# Patient Record
Sex: Female | Born: 1985
Health system: Southern US, Community
[De-identification: ages and names within clinical notes are randomized; demographics above are authoritative.]

## PROBLEM LIST (undated history)

## (undated) DIAGNOSIS — F419 Anxiety disorder, unspecified: Secondary | ICD-10-CM

## (undated) DIAGNOSIS — M419 Scoliosis, unspecified: Secondary | ICD-10-CM

## (undated) DIAGNOSIS — G43909 Migraine, unspecified, not intractable, without status migrainosus: Secondary | ICD-10-CM

## (undated) DIAGNOSIS — J45909 Unspecified asthma, uncomplicated: Secondary | ICD-10-CM

## (undated) HISTORY — PX: MOUTH SURGERY: SHX715

## (undated) HISTORY — DX: Unspecified asthma, uncomplicated: J45.909

## (undated) HISTORY — DX: Anxiety disorder, unspecified: F41.9

## (undated) HISTORY — PX: TONSILLECTOMY: SUR1361

## (undated) HISTORY — DX: Migraine, unspecified, not intractable, without status migrainosus: G43.909

---
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
## 2008-03-18 ENCOUNTER — Inpatient Hospital Stay (HOSPITAL_COMMUNITY): Admission: AD | Admit: 2008-03-18 | Discharge: 2008-03-20 | Payer: Self-pay | Admitting: Obstetrics & Gynecology

## 2008-03-18 ENCOUNTER — Ambulatory Visit: Payer: Self-pay | Admitting: Obstetrics & Gynecology

## 2008-03-31 ENCOUNTER — Ambulatory Visit: Payer: Self-pay | Admitting: Obstetrics & Gynecology

## 2008-03-31 ENCOUNTER — Encounter: Payer: Self-pay | Admitting: Family Medicine

## 2008-03-31 ENCOUNTER — Encounter: Payer: Self-pay | Admitting: Obstetrics & Gynecology

## 2008-04-07 ENCOUNTER — Ambulatory Visit: Payer: Self-pay | Admitting: Obstetrics & Gynecology

## 2008-04-08 ENCOUNTER — Ambulatory Visit (HOSPITAL_COMMUNITY): Admission: RE | Admit: 2008-04-08 | Discharge: 2008-04-08 | Payer: Self-pay | Admitting: Obstetrics & Gynecology

## 2008-04-08 ENCOUNTER — Encounter: Payer: Self-pay | Admitting: Obstetrics & Gynecology

## 2008-04-14 ENCOUNTER — Ambulatory Visit: Payer: Self-pay | Admitting: Family Medicine

## 2008-04-21 ENCOUNTER — Ambulatory Visit: Payer: Self-pay | Admitting: Family Medicine

## 2008-04-26 ENCOUNTER — Inpatient Hospital Stay (HOSPITAL_COMMUNITY): Admission: AD | Admit: 2008-04-26 | Discharge: 2008-04-29 | Payer: Self-pay | Admitting: Obstetrics & Gynecology

## 2008-04-26 ENCOUNTER — Ambulatory Visit: Payer: Self-pay | Admitting: Obstetrics and Gynecology

## 2009-05-22 ENCOUNTER — Emergency Department (HOSPITAL_COMMUNITY): Admission: EM | Admit: 2009-05-22 | Discharge: 2009-05-22 | Payer: Self-pay | Admitting: Emergency Medicine

## 2009-11-04 ENCOUNTER — Emergency Department (HOSPITAL_COMMUNITY): Admission: EM | Admit: 2009-11-04 | Discharge: 2009-11-04 | Payer: Self-pay | Admitting: Emergency Medicine

## 2010-07-01 ENCOUNTER — Inpatient Hospital Stay (INDEPENDENT_AMBULATORY_CARE_PROVIDER_SITE_OTHER)
Admission: RE | Admit: 2010-07-01 | Discharge: 2010-07-01 | Disposition: A | Discharge status: Home / Self Care | Payer: Medicaid Other | Source: Ambulatory Visit | Attending: Emergency Medicine | Admitting: Emergency Medicine

## 2010-07-01 DIAGNOSIS — J069 Acute upper respiratory infection, unspecified: Secondary | ICD-10-CM

## 2010-07-01 DIAGNOSIS — R112 Nausea with vomiting, unspecified: Secondary | ICD-10-CM

## 2010-07-01 LAB — POCT PREGNANCY, URINE: Preg Test, Ur: NEGATIVE

## 2010-07-01 LAB — POCT URINALYSIS DIPSTICK
Hgb urine dipstick: NEGATIVE
Nitrite: NEGATIVE
Protein, ur: NEGATIVE mg/dL
Specific Gravity, Urine: 1.02 (ref 1.005–1.030)
pH: 6 (ref 5.0–8.0)

## 2010-07-19 LAB — BASIC METABOLIC PANEL
CO2: 26 mEq/L (ref 19–32)
Calcium: 8.8 mg/dL (ref 8.4–10.5)
Creatinine, Ser: 0.7 mg/dL (ref 0.4–1.2)

## 2010-07-19 LAB — DIFFERENTIAL
Basophils Absolute: 0 10*3/uL (ref 0.0–0.1)
Basophils Relative: 0 % (ref 0–1)
Eosinophils Relative: 0 % (ref 0–5)
Lymphocytes Relative: 10 % — ABNORMAL LOW (ref 12–46)
Monocytes Absolute: 1.4 10*3/uL — ABNORMAL HIGH (ref 0.1–1.0)
Neutro Abs: 9.5 10*3/uL — ABNORMAL HIGH (ref 1.7–7.7)

## 2010-07-19 LAB — CBC
HCT: 41.4 % (ref 36.0–46.0)
Hemoglobin: 13.8 g/dL (ref 12.0–15.0)
MCHC: 33.4 g/dL (ref 30.0–36.0)
Platelets: 125 10*3/uL — ABNORMAL LOW (ref 150–400)
RBC: 4.39 MIL/uL (ref 3.87–5.11)
RDW: 13.1 % (ref 11.5–15.5)

## 2010-07-19 LAB — URINE CULTURE: Colony Count: >=100000

## 2010-07-19 LAB — URINALYSIS, ROUTINE W REFLEX MICROSCOPIC
Glucose, UA: NEGATIVE mg/dL
Nitrite: POSITIVE — AB
Specific Gravity, Urine: 1.02 (ref 1.005–1.030)
Urobilinogen, UA: 2 mg/dL — ABNORMAL HIGH (ref 0.0–1.0)
pH: 5.5 (ref 5.0–8.0)

## 2010-07-19 LAB — URINE MICROSCOPIC-ADD ON

## 2010-07-21 ENCOUNTER — Emergency Department (HOSPITAL_COMMUNITY)
Admission: EM | Admit: 2010-07-21 | Discharge: 2010-07-21 | Disposition: A | Discharge status: Home / Self Care | Payer: Medicaid Other | Attending: Emergency Medicine | Admitting: Emergency Medicine

## 2010-07-21 DIAGNOSIS — H9209 Otalgia, unspecified ear: Secondary | ICD-10-CM | POA: Insufficient documentation

## 2010-07-21 DIAGNOSIS — J069 Acute upper respiratory infection, unspecified: Secondary | ICD-10-CM | POA: Insufficient documentation

## 2010-09-14 NOTE — Discharge Summary (Signed)
NAMELINNAE, Samantha Nicholson                 ACCOUNT NO.:  [id number]   MEDICAL RECORD NO.:  [id number]          PATIENT TYPE:  INP   LOCATION:  9154                          FACILITY:  [facility]   PHYSICIAN:  Tanya S. Shawnie Pons, M.D.   DATE OF BIRTH:  [birth year]   DATE OF ADMISSION:  03/18/2008  DATE OF DISCHARGE:  03/20/2008                               DISCHARGE SUMMARY   FINAL DIAGNOSES:  1. Intrauterine pregnancy at 32 weeks.  2. No prenatal care.  3. Preterm contractions and shortened cervix.  4. History of preterm birth in the past.   PERTINENT LABS:  Urine drug screen was negative, GC negative, Chlamydia  negative.  SSA and fetal fibronectin was negative.  Blood type was A  positive.  Antibody screen negative.  Rapid HIV nonreactive.  Hemoglobin  11.1, white blood cell count 15, and platelets 168.  Wet prep shows few  clue cells.  Negative urinalysis.   X-RAYS:  The patient underwent OB ultrasound that revealed a single  intrauterine pregnancy in a vertex presentation with AFI of 11.8.  Estimated fetal weight was 1802 by an unsure LMP.  Her EDC would have  been on May 21, 2008 by ultrasound.  Her EDC is on May 13, 2008.   REASON FOR ADMISSION:  Briefly, please see H&P in the chart.  The  patient presented to the Maternity Admissions Unit with complained of  abdominal pain.  She reported just fearing out as she was pregnant.  The  patient was found to be having some contractions on cervix that was  short and ultrasound of 2.3 cm and was fingertip dilated, and for this  reason, she was admitted for 48 hours for betamethasone for tocolysis.   HOSPITAL COURSE:  The patient was admitted to the antenatal service.  She was given Procardia, which did not hold her contractions.  She was  switched to magnesium sulfate, which she received for approximately 12  hours.  After that, she was started back on Procardia XL 30 mg.  She got  a NICU consult.  She continued to have some contractions,  but no  significant cervical change.  Her cervix was checked on the evening  prior to discharge and it was still fingertip dilated.  She received two  courses of betamethasone and it was felt with a negative SSA, and she  was stable for discharge.   DISCHARGE DISPOSITION AND CONDITION:  The patient was discharged home in  improved condition.  Follow up will be at the [facility] on  March 31, 2008 at 8:45 in the morning.  The patient was instructed to  remain on bed rest as much as possible.  Preterm labor precautions were  given.   DISCHARGE MEDICATIONS:  1. Flagyl 500 mg 1 p.o. b.i.d. x7 days.  2. Procardia XL, we will increase to 60 mg 1 p.o. b.i.d. acute      contractions.       Shelbie Proctor. Shawnie Pons, M.D.  Electronically Signed     TSP/MEDQ  D:  03/20/2008  T:  03/20/2008  Job:  [id number]

## 2011-01-01 ENCOUNTER — Emergency Department (HOSPITAL_COMMUNITY)
Admission: EM | Admit: 2011-01-01 | Discharge: 2011-01-01 | Disposition: A | Discharge status: Home / Self Care | Payer: Medicaid Other | Attending: Emergency Medicine | Admitting: Emergency Medicine

## 2011-01-01 ENCOUNTER — Emergency Department (HOSPITAL_COMMUNITY): Payer: Medicaid Other

## 2011-01-01 DIAGNOSIS — M25539 Pain in unspecified wrist: Secondary | ICD-10-CM | POA: Insufficient documentation

## 2011-01-01 DIAGNOSIS — M4120 Other idiopathic scoliosis, site unspecified: Secondary | ICD-10-CM | POA: Insufficient documentation

## 2011-02-01 LAB — CBC
Hemoglobin: 11.1 — ABNORMAL LOW
MCHC: 34.7
MCV: 96.9
Platelets: 168
RDW: 13.3
WBC: 15.1 — ABNORMAL HIGH

## 2011-02-01 LAB — STREP B DNA PROBE: Strep Group B Ag: NEGATIVE

## 2011-02-01 LAB — URINALYSIS, ROUTINE W REFLEX MICROSCOPIC
Hgb urine dipstick: NEGATIVE
Nitrite: NEGATIVE
Specific Gravity, Urine: 1.015

## 2011-02-01 LAB — RAPID URINE DRUG SCREEN, HOSP PERFORMED
Amphetamines: NOT DETECTED
Cocaine: NOT DETECTED

## 2011-02-01 LAB — GC/CHLAMYDIA PROBE AMP, GENITAL
Chlamydia, DNA Probe: NEGATIVE
GC Probe Amp, Genital: NEGATIVE

## 2011-02-01 LAB — RPR: RPR Ser Ql: NONREACTIVE

## 2011-02-01 LAB — DIFFERENTIAL
Basophils Relative: 0
Lymphocytes Relative: 6 — ABNORMAL LOW
Lymphs Abs: 0.9
Monocytes Relative: 2 — ABNORMAL LOW
Neutro Abs: 13.9 — ABNORMAL HIGH
Neutrophils Relative %: 93 — ABNORMAL HIGH

## 2011-02-01 LAB — POCT URINALYSIS DIP (DEVICE)
Bilirubin Urine: NEGATIVE
Glucose, UA: NEGATIVE
Nitrite: NEGATIVE
pH: 6.5

## 2011-02-01 LAB — RUBELLA SCREEN: Rubella: 40.1 — ABNORMAL HIGH

## 2011-02-01 LAB — RAPID HIV SCREEN (WH-MAU): Rapid HIV Screen: NONREACTIVE

## 2011-02-01 LAB — WET PREP, GENITAL

## 2011-02-04 LAB — POCT URINALYSIS DIP (DEVICE)
Bilirubin Urine: NEGATIVE
Glucose, UA: NEGATIVE mg/dL
Glucose, UA: NEGATIVE mg/dL
Glucose, UA: NEGATIVE mg/dL
Hgb urine dipstick: NEGATIVE
Ketones, ur: NEGATIVE mg/dL
Nitrite: NEGATIVE
Nitrite: NEGATIVE
Protein, ur: 30 mg/dL — AB
Specific Gravity, Urine: 1.02 (ref 1.005–1.030)
Urobilinogen, UA: 0.2 mg/dL (ref 0.0–1.0)

## 2011-02-04 LAB — CBC
Hemoglobin: 12 g/dL (ref 12.0–15.0)
RBC: 3.59 MIL/uL — ABNORMAL LOW (ref 3.87–5.11)

## 2011-02-17 LAB — WET PREP, GENITAL
Clue Cells Wet Prep HPF POC: NONE SEEN
Yeast Wet Prep HPF POC: NONE SEEN

## 2011-02-17 LAB — CBC
HCT: 36.4
Hemoglobin: 12.4
MCV: 93.3
RBC: 3.9
WBC: 9.5

## 2011-02-17 LAB — GC/CHLAMYDIA PROBE AMP, GENITAL: Chlamydia, DNA Probe: NEGATIVE

## 2011-05-04 ENCOUNTER — Other Ambulatory Visit: Payer: Self-pay | Admitting: Otolaryngology

## 2012-08-16 ENCOUNTER — Emergency Department (HOSPITAL_COMMUNITY)
Admission: EM | Admit: 2012-08-16 | Discharge: 2012-08-16 | Disposition: A | Discharge status: Home / Self Care | Payer: Medicaid Other | Source: Home / Self Care | Attending: Family Medicine | Admitting: Family Medicine

## 2012-08-16 ENCOUNTER — Encounter (HOSPITAL_COMMUNITY): Payer: Self-pay | Admitting: Emergency Medicine

## 2012-08-16 DIAGNOSIS — J309 Allergic rhinitis, unspecified: Secondary | ICD-10-CM

## 2012-08-16 MED ORDER — CETIRIZINE-PSEUDOEPHEDRINE ER 5-120 MG PO TB12: 1.0000 | ORAL_TABLET | Freq: Two times a day (BID) | ORAL | Status: DC | PRN

## 2012-08-16 MED ORDER — PREDNISONE 20 MG PO TABS: ORAL_TABLET | ORAL | Status: DC

## 2012-08-16 MED ORDER — GUAIFENESIN-CODEINE 100-10 MG/5ML PO SYRP: 5.0000 mL | ORAL_SOLUTION | Freq: Three times a day (TID) | ORAL | Status: DC | PRN

## 2012-08-16 NOTE — ED Notes (Signed)
Pt c/o cold sx onset Saturday Sx include: dry cough, sneezing, nasal congestion, runny nose Denies: f/v/n/d Reports that her daughter was dx w/Strep on Sat  She is alert and oriented w/no signs of acute distress.

## 2012-08-16 NOTE — ED Provider Notes (Signed)
History     CSN: [id number]  Arrival date & time 08/16/12  1151   First MD Initiated Contact with Patient 08/16/12 1220      Chief Complaint  Patient presents with  . URI    (Consider location/radiation/quality/duration/timing/severity/associated sxs/prior treatment) HPI Comments: 27 year old female with no significant past medical history. Here complaining of nasal congestion, clear rhinorrhea, sinus pressure, sneezing and dry cough for 4 days. Denies fever or chills. No chest pain or difficulty breathing. Patient concerned as her child was diagnosed with a strep throat 5 days ago. Patient denies sore throat, no headache or abdominal pain. No nausea or vomiting. No diarrhea. No rash.   History reviewed. No pertinent past medical history.  Past Surgical History  Procedure Laterality Date  . Tonsillectomy    . Mouth surgery      No family history on file.  History  Substance Use Topics  . Smoking status: Never Smoker   . Smokeless tobacco: Not on file  . Alcohol Use: No    OB History   Grav Para Term Preterm Abortions TAB SAB Ect Mult Living                  Review of Systems  Constitutional: Negative for fever, chills, diaphoresis, activity change, appetite change and fatigue.  HENT: Positive for congestion, rhinorrhea, sneezing and sinus pressure. Negative for sore throat.   Eyes: Positive for itching.  Respiratory: Negative for cough, shortness of breath and wheezing.   Cardiovascular: Negative for chest pain.  Gastrointestinal: Negative for nausea, vomiting and abdominal pain.  Skin: Negative for rash.  Neurological: Negative for headaches.    Allergies  Review of patient's allergies indicates no known allergies.  Home Medications   Current Outpatient Rx  Name  Route  Sig  Dispense  Refill  . cetirizine-pseudoephedrine (ZYRTEC-D) 5-120 MG per tablet   Oral   Take 1 tablet by mouth 2 (two) times daily as needed for allergies or rhinitis.   30 tablet  0   . guaiFENesin-codeine (ROBITUSSIN AC) 100-10 MG/5ML syrup   Oral   Take 5 mLs by mouth 3 (three) times daily as needed for cough.   120 mL   0   . predniSONE (DELTASONE) 20 MG tablet      2 tabs po daily for 5 days   10 tablet   0     BP 125/77  Pulse 85  Temp(Src) 98.1 F (36.7 C) (Oral)  Resp 18  SpO2 98%  Physical Exam  Nursing note and vitals reviewed. Constitutional: She is oriented to person, place, and time. She appears well-developed and well-nourished. No distress.  HENT:  Head: Normocephalic and atraumatic.  Nasal Congestion with erythema and swelling of nasal turbinates, clear rhinorrhea. No pharyngeal erythema no exudates. No uvula deviation. No trismus. TM's with clear fluid behind bilaterally otherwise normal.  Neck: Neck supple.  Cardiovascular: Normal heart sounds.   Pulmonary/Chest: Breath sounds normal.  Lymphadenopathy:    She has no cervical adenopathy.  Neurological: She is alert and oriented to person, place, and time.  Skin: No rash noted. She is not diaphoretic.    ED Course  Procedures (including critical care time)  Labs Reviewed  POCT RAPID STREP A ([facility] CARE ONLY)   No results found.   1. Allergic rhinosinusitis       MDM  Negative strep test. Treated with prednisone, cetirizine/pseudoephedrine and guaifenesin/codeine. Supportive care and red flags that should prompt her return to medical attention discussed  with patient and provided in writing.        Sharin Grave, MD 08/17/12 [phone]

## 2012-11-18 ENCOUNTER — Encounter (HOSPITAL_COMMUNITY): Payer: Self-pay | Admitting: Emergency Medicine

## 2012-11-18 ENCOUNTER — Emergency Department (HOSPITAL_COMMUNITY)
Admission: EM | Admit: 2012-11-18 | Discharge: 2012-11-19 | Disposition: A | Discharge status: Home / Self Care | Payer: Medicaid Other | Attending: Emergency Medicine | Admitting: Emergency Medicine

## 2012-11-18 DIAGNOSIS — Z3202 Encounter for pregnancy test, result negative: Secondary | ICD-10-CM | POA: Insufficient documentation

## 2012-11-18 DIAGNOSIS — R61 Generalized hyperhidrosis: Secondary | ICD-10-CM | POA: Insufficient documentation

## 2012-11-18 DIAGNOSIS — R0989 Other specified symptoms and signs involving the circulatory and respiratory systems: Secondary | ICD-10-CM | POA: Insufficient documentation

## 2012-11-18 DIAGNOSIS — R6883 Chills (without fever): Secondary | ICD-10-CM | POA: Insufficient documentation

## 2012-11-18 DIAGNOSIS — R110 Nausea: Secondary | ICD-10-CM | POA: Insufficient documentation

## 2012-11-18 DIAGNOSIS — M940 Chondrocostal junction syndrome [Tietze]: Secondary | ICD-10-CM | POA: Insufficient documentation

## 2012-11-18 DIAGNOSIS — R0609 Other forms of dyspnea: Secondary | ICD-10-CM | POA: Insufficient documentation

## 2012-11-18 DIAGNOSIS — M6281 Muscle weakness (generalized): Secondary | ICD-10-CM | POA: Insufficient documentation

## 2012-11-18 DIAGNOSIS — Z79899 Other long term (current) drug therapy: Secondary | ICD-10-CM | POA: Insufficient documentation

## 2012-11-18 LAB — CBC WITH DIFFERENTIAL/PLATELET
Basophils Relative: 0 % (ref 0–1)
Eosinophils Absolute: 0 10*3/uL (ref 0.0–0.7)
Hemoglobin: 13.7 g/dL (ref 12.0–15.0)
MCH: 31.7 pg (ref 26.0–34.0)
MCHC: 35.2 g/dL (ref 30.0–36.0)
Monocytes Relative: 8 % (ref 3–12)
Neutrophils Relative %: 63 % (ref 43–77)
Platelets: 175 10*3/uL (ref 150–400)

## 2012-11-18 NOTE — ED Notes (Signed)
Pt states she donated blood for the 1st time yesterday.  2 hours later she started having generalized weakness, feeling cold, headache, intermittent stabbing pain in center of chest, sob, dizziness, nausea, and pain in center of abd.

## 2012-11-18 NOTE — ED Notes (Signed)
Phlebotomy called.  

## 2012-11-18 NOTE — ED Notes (Signed)
2340  Pt ambulatory to the room

## 2012-11-18 NOTE — ED Notes (Signed)
Phlebotomy called x2

## 2012-11-19 ENCOUNTER — Emergency Department (HOSPITAL_COMMUNITY): Payer: Medicaid Other

## 2012-11-19 LAB — URINALYSIS, ROUTINE W REFLEX MICROSCOPIC
Glucose, UA: NEGATIVE mg/dL
Ketones, ur: 15 mg/dL — AB
Leukocytes, UA: NEGATIVE
Specific Gravity, Urine: 1.031 — ABNORMAL HIGH (ref 1.005–1.030)
pH: 6 (ref 5.0–8.0)

## 2012-11-19 LAB — POCT PREGNANCY, URINE: Preg Test, Ur: NEGATIVE

## 2012-11-19 LAB — COMPREHENSIVE METABOLIC PANEL
Albumin: 3.6 g/dL (ref 3.5–5.2)
Alkaline Phosphatase: 65 U/L (ref 39–117)
BUN: 8 mg/dL (ref 6–23)
Calcium: 9.1 mg/dL (ref 8.4–10.5)
Potassium: 3.9 mEq/L (ref 3.5–5.1)
Total Protein: 7.1 g/dL (ref 6.0–8.3)

## 2012-11-19 MED ORDER — SODIUM CHLORIDE 0.9 % IV BOLUS (SEPSIS)
1000.0000 mL | Freq: Once | INTRAVENOUS | Status: AC
  Administered 2012-11-19: 1000 mL via INTRAVENOUS

## 2012-11-19 MED ORDER — KETOROLAC TROMETHAMINE 30 MG/ML IJ SOLN
30.0000 mg | Freq: Once | INTRAMUSCULAR | Status: AC
  Administered 2012-11-19: 30 mg via INTRAVENOUS
  Filled 2012-11-19: qty 1

## 2012-11-19 NOTE — ED Provider Notes (Signed)
History    CSN: [id number] Arrival date & time 11/18/12  2136  First MD Initiated Contact with Patient 11/19/12 0135     Chief Complaint  Patient presents with  . Weakness  . Chest Pain   (Consider location/radiation/quality/duration/timing/severity/associated sxs/prior Treatment) Patient is a 27 y.o. female presenting with weakness and chest pain. The history is provided by the patient.  Weakness Associated symptoms include chest pain.  Chest Pain Associated symptoms: weakness   She donated blood on July 19 and following that, I felt generally weak. She went home and drank a lot of fluids. That evening, she started developing a left parasternal chest pain. Pain is sharp and worse with deep breathing and worse with movement. She rates the pain at 6/10. There is associated dyspnea, nausea, diaphoresis and she did have chills. She denies any cough. She has not treated it with anything. She has no significant past medical history and is a nonsmoker. History reviewed. No pertinent past medical history. Past Surgical History  Procedure Laterality Date  . Tonsillectomy    . Mouth surgery     No family history on file. History  Substance Use Topics  . Smoking status: Never Smoker   . Smokeless tobacco: Not on file  . Alcohol Use: No   OB History   Grav Para Term Preterm Abortions TAB SAB Ect Mult Living                 Review of Systems  Cardiovascular: Positive for chest pain.  Neurological: Positive for weakness.  All other systems reviewed and are negative.    Allergies  Review of patient's allergies indicates no known allergies.  Home Medications   Current Outpatient Rx  Name  Route  Sig  Dispense  Refill  . etonogestrel (NEXPLANON) 68 MG IMPL implant   Subcutaneous   Inject 1 each into the skin once.          BP 128/97  Pulse 105  Temp(Src) 97.8 F (36.6 C) (Oral)  Resp 18  SpO2 99%  LMP 10/28/2012 Physical Exam  Nursing note and vitals  reviewed.  27 year old female, resting comfortably and in no acute distress. Vital signs are significant for mild tachycardia with heart rate 105, diastolic hypertension with blood pressure 120/97. Oxygen saturation is 99%, which is normal. Head is normocephalic and atraumatic. PERRLA, EOMI. Oropharynx is clear. Neck is nontender and supple without adenopathy or JVD. Back is nontender and there is no CVA tenderness. Lungs are clear without rales, wheezes, or rhonchi. Chest is  mildly tender in the left parasternal area which reproduces her pain. Heart has regular rate and rhythm without murmur. Abdomen is soft, flat, nontender without masses or hepatosplenomegaly and peristalsis is normoactive. Extremities have no cyanosis or edema, full range of motion is present. Skin is warm and dry without rash. Neurologic: Mental status is normal, cranial nerves are intact, there are no motor or sensory deficits.   ED Course  Procedures (including critical care time) Results for orders placed during the hospital encounter of 11/18/12  CBC WITH DIFFERENTIAL      Result Value Range   WBC 10.0  4.0 - 10.5 K/uL   RBC 4.32  3.87 - 5.11 MIL/uL   Hemoglobin 13.7  12.0 - 15.0 g/dL   HCT 40.9  81.1 - 91.4 %   MCV 90.0  78.0 - 100.0 fL   MCH 31.7  26.0 - 34.0 pg   MCHC 35.2  30.0 - 36.0 g/dL  RDW 12.5  11.5 - 15.5 %   Platelets 175  150 - 400 K/uL   Neutrophils Relative % 63  43 - 77 %   Neutro Abs 6.3  1.7 - 7.7 K/uL   Lymphocytes Relative 29  12 - 46 %   Lymphs Abs 2.9  0.7 - 4.0 K/uL   Monocytes Relative 8  3 - 12 %   Monocytes Absolute 0.8  0.1 - 1.0 K/uL   Eosinophils Relative 0  0 - 5 %   Eosinophils Absolute 0.0  0.0 - 0.7 K/uL   Basophils Relative 0  0 - 1 %   Basophils Absolute 0.0  0.0 - 0.1 K/uL  COMPREHENSIVE METABOLIC PANEL      Result Value Range   Sodium 140  135 - 145 mEq/L   Potassium 3.9  3.5 - 5.1 mEq/L   Chloride 106  96 - 112 mEq/L   CO2 27  19 - 32 mEq/L   Glucose, Bld  114 (*) 70 - 99 mg/dL   BUN 8  6 - 23 mg/dL   Creatinine, Ser 1.61  0.50 - 1.10 mg/dL   Calcium 9.1  8.4 - 09.6 mg/dL   Total Protein 7.1  6.0 - 8.3 g/dL   Albumin 3.6  3.5 - 5.2 g/dL   AST 14  0 - 37 U/L   ALT 18  0 - 35 U/L   Alkaline Phosphatase 65  39 - 117 U/L   Total Bilirubin 0.4  0.3 - 1.2 mg/dL   GFR calc non Af Amer >90  >90 mL/min   GFR calc Af Amer >90  >90 mL/min  URINALYSIS, ROUTINE W REFLEX MICROSCOPIC      Result Value Range   Color, Urine AMBER (*) YELLOW   APPearance CLEAR  CLEAR   Specific Gravity, Urine 1.031 (*) 1.005 - 1.030   pH 6.0  5.0 - 8.0   Glucose, UA NEGATIVE  NEGATIVE mg/dL   Hgb urine dipstick NEGATIVE  NEGATIVE   Bilirubin Urine SMALL (*) NEGATIVE   Ketones, ur 15 (*) NEGATIVE mg/dL   Protein, ur NEGATIVE  NEGATIVE mg/dL   Urobilinogen, UA 1.0  0.0 - 1.0 mg/dL   Nitrite NEGATIVE  NEGATIVE   Leukocytes, UA NEGATIVE  NEGATIVE  D-DIMER, QUANTITATIVE      Result Value Range   D-Dimer, Quant <0.27  0.00 - 0.48 ug/mL-FEU  POCT PREGNANCY, URINE      Result Value Range   Preg Test, Ur NEGATIVE  NEGATIVE  POCT I-STAT TROPONIN I      Result Value Range   Troponin i, poc 0.00  0.00 - 0.08 ng/mL   Comment 3            Dg Chest 2 View  11/19/2012   *RADIOLOGY REPORT*  Clinical Data: Weakness, chest pain  CHEST - 2 VIEW  Comparison: None.  Findings: Cardiac and mediastinal silhouettes within normal limits.  Lungs are normally inflated.  No airspace consolidation, pleural effusion, or pulmonary edema.  No pneumothorax.  Dextroscoliosis of the thoracic spine is noted.  No acute osseous abnormality.  IMPRESSION: 1.  No acute cardiopulmonary process. 2.  Dextroscoliosis of the thoracic spine.   Original Report Authenticated By: Rise Mu, M.D.      Date: 11/19/2012  Rate: 122  Rhythm: sinus tachycardia  QRS Axis: normal  Intervals: normal  ST/T Wave abnormalities: nonspecific ST/T changes  Conduction Disutrbances:none  Narrative  Interpretation: Sinus tachycardia with nonspecific ST and T changes are  of uncertain significance. No prior ECG available for comparison.  Old EKG Reviewed: none available   1. Costochondritis     MDM   chest pain which seems most consistent with costochondritis. I cannot relate her chest pain in any way to her having given blood. Chest x-ray will be obtained and she will be given an bolus of IV fluid. D-dimer will be obtained to rule out pulmonary embolism since she is on hormonal contraception.  Workup is negative including normal d-dimer, and troponin, normal chest x-ray. She got excellent relief of pain with ketorolac. She is discharged with instructions to use over-the-counter ibuprofen or naproxen as needed.  Dione Booze, MD 11/19/12 [phone]

## 2013-03-07 ENCOUNTER — Other Ambulatory Visit: Payer: Self-pay

## 2014-08-03 ENCOUNTER — Encounter (HOSPITAL_COMMUNITY): Payer: Self-pay | Admitting: *Deleted

## 2014-08-03 ENCOUNTER — Emergency Department (HOSPITAL_COMMUNITY)
Admission: EM | Admit: 2014-08-03 | Discharge: 2014-08-03 | Disposition: A | Discharge status: Home / Self Care | Payer: Medicaid Other | Attending: Emergency Medicine | Admitting: Emergency Medicine

## 2014-08-03 DIAGNOSIS — R05 Cough: Secondary | ICD-10-CM | POA: Diagnosis present

## 2014-08-03 DIAGNOSIS — J029 Acute pharyngitis, unspecified: Secondary | ICD-10-CM | POA: Diagnosis not present

## 2014-08-03 DIAGNOSIS — H9201 Otalgia, right ear: Secondary | ICD-10-CM | POA: Insufficient documentation

## 2014-08-03 LAB — RAPID STREP SCREEN (MED CTR MEBANE ONLY): STREPTOCOCCUS, GROUP A SCREEN (DIRECT): NEGATIVE

## 2014-08-03 MED ORDER — IBUPROFEN 800 MG PO TABS
800.0000 mg | ORAL_TABLET | Freq: Three times a day (TID) | ORAL | Status: DC | PRN
Start: 2014-08-03 — End: 2016-07-13

## 2014-08-03 MED ORDER — HYDROCODONE-ACETAMINOPHEN 7.5-325 MG/15ML PO SOLN: 10.0000 mL | Freq: Four times a day (QID) | ORAL | Status: DC | PRN

## 2014-08-03 NOTE — ED Provider Notes (Signed)
CSN: [id number]     Arrival date & time 08/03/14  1107 History  This chart was scribed for non-physician practitioner Trixie DredgeEmily Keagon Glascoe, PA-C, working with Benjiman CoreNathan Pickering, MD by Littie Deedsichard Sun, ED Scribe. This patient was seen in room TR05C/TR05C and the patient's care was started at 11:35 AM.      Chief Complaint  Patient presents with  . URI   The history is provided by the patient. No language interpreter was used.   HPI Comments: Samantha Nicholson is a 29 y.o. female who presents to the Emergency Department complaining of gradual onset URI symptoms that started last night. Patient reports having sore throat, right ear pain (described as fullness), sinus pressure, generalized myalgias, congestion, voice changes, fever of 101.5 F last night, and mild cough. The sore throat is worsened with swallowing and breathing. She has not tried anything for her symptoms. Her daughter had strep throat last week. She denies chest pain.    History reviewed. No pertinent past medical history. Past Surgical History  Procedure Laterality Date  . Tonsillectomy    . Mouth surgery     History reviewed. No pertinent family history. History  Substance Use Topics  . Smoking status: Never Smoker   . Smokeless tobacco: Not on file  . Alcohol Use: No   OB History    No data available     Review of Systems  Constitutional: Positive for fever.  HENT: Positive for congestion, ear pain and sore throat. Negative for trouble swallowing.   Respiratory: Positive for cough.   Cardiovascular: Negative for chest pain.  Musculoskeletal: Positive for myalgias.  Allergic/Immunologic: Negative for immunocompromised state.  Hematological: Does not bruise/bleed easily.      Allergies  Review of patient's allergies indicates no known allergies.  Home Medications   Prior to Admission medications   Medication Sig Start Date End Date Taking? Authorizing Provider  etonogestrel (NEXPLANON) 68 MG IMPL implant Inject 1 each into  the skin once.    Historical Provider, MD   BP 135/77 mmHg  Pulse 100  Temp(Src) 97.6 F (36.4 C) (Oral)  Resp 18  Ht 5\' 4"  (1.626 m)  Wt 160 lb (72.576 kg)  BMI 27.45 kg/m2  SpO2 100% Physical Exam  Constitutional: She appears well-developed and well-nourished. No distress.  HENT:  Head: Normocephalic and atraumatic.  Mouth/Throat: Posterior oropharyngeal erythema present. No oropharyngeal exudate or posterior oropharyngeal edema.  No oropharyngeal discharge.  Eyes: Conjunctivae are normal.  Neck: Neck supple.  Cardiovascular: Normal rate and regular rhythm.   Pulmonary/Chest: Effort normal and breath sounds normal. No respiratory distress. She has no wheezes. She has no rales.  Neurological: She is alert.  Skin: She is not diaphoretic.  Nursing note and vitals reviewed.   ED Course  Procedures  DIAGNOSTIC STUDIES: Oxygen Saturation is 100% on room air, normal by my interpretation.    COORDINATION OF CARE: 11:38 AM-Discussed treatment plan which includes strep test and medications with pt at bedside and pt agreed to plan. Recommended patient to stay hydrated.   Labs Review Labs Reviewed  RAPID STREP SCREEN  CULTURE, GROUP A STREP    Imaging Review No results found.   EKG Interpretation None      MDM   Final diagnoses:  Pharyngitis   Afebrile, nontoxic patient with constellation of symptoms suggestive of viral syndrome.  No concerning findings on exam.  Strep screen negative.  Culture pending. Discharged home with supportive care, PCP follow up.  Discussed result, findings, treatment, and follow  up  with patient.  Pt given return precautions.  Pt verbalizes understanding and agrees with plan.      I personally performed the services described in this documentation, which was scribed in my presence. The recorded information has been reviewed and is accurate.     Trixie Dredge, PA-C 08/03/14 1324  Benjiman Core, MD 08/06/14 [phone]

## 2014-08-03 NOTE — ED Notes (Signed)
Pt reports that she started feeling bad last night, having fever, sore throat, congestion. No acute distress noted at triage.

## 2014-08-03 NOTE — Discharge Instructions (Signed)
Read the information below.  Use the prescribed medication as directed.  Please discuss all new medications with your pharmacist.  Do not take additional tylenol while taking the prescribed pain medication to avoid overdose.  You may return to the Emergency Department at any time for worsening condition or any new symptoms that concern you.  If you develop high fevers, difficulty swallowing or breathing, or you are unable to tolerate fluids by mouth, return to the ER immediately for a recheck.    ° ° °Pharyngitis °Pharyngitis is redness, pain, and swelling (inflammation) of your pharynx.  °CAUSES  °Pharyngitis is usually caused by infection. Most of the time, these infections are from viruses (viral) and are part of a cold. However, sometimes pharyngitis is caused by bacteria (bacterial). Pharyngitis can also be caused by allergies. Viral pharyngitis may be spread from person to person by coughing, sneezing, and personal items or utensils (cups, forks, spoons, toothbrushes). Bacterial pharyngitis may be spread from person to person by more intimate contact, such as kissing.  °SIGNS AND SYMPTOMS  °Symptoms of pharyngitis include:   °· Sore throat.   °· Tiredness (fatigue).   °· Low-grade fever.   °· Headache. °· Joint pain and muscle aches. °· Skin rashes. °· Swollen lymph nodes. °· Plaque-like film on throat or tonsils (often seen with bacterial pharyngitis). °DIAGNOSIS  °Your health care provider will ask you questions about your illness and your symptoms. Your medical history, along with a physical exam, is often all that is needed to diagnose pharyngitis. Sometimes, a rapid strep test is done. Other lab tests may also be done, depending on the suspected cause.  °TREATMENT  °Viral pharyngitis will usually get better in 3-4 days without the use of medicine. Bacterial pharyngitis is treated with medicines that kill germs (antibiotics).  °HOME CARE INSTRUCTIONS  °· Drink enough water and fluids to keep your urine  clear or pale yellow.   °· Only take over-the-counter or prescription medicines as directed by your health care provider:   °¨ If you are prescribed antibiotics, make sure you finish them even if you start to feel better.   °¨ Do not take aspirin.   °· Get lots of rest.   °· Gargle with 8 oz of salt water (½ tsp of salt per 1 qt of water) as often as every 1-2 hours to soothe your throat.   °· Throat lozenges (if you are not at risk for choking) or sprays may be used to soothe your throat. °SEEK MEDICAL CARE IF:  °· You have large, tender lumps in your neck. °· You have a rash. °· You cough up green, yellow-brown, or bloody spit. °SEEK IMMEDIATE MEDICAL CARE IF:  °· Your neck becomes stiff. °· You drool or are unable to swallow liquids. °· You vomit or are unable to keep medicines or liquids down. °· You have severe pain that does not go away with the use of recommended medicines. °· You have trouble breathing (not caused by a stuffy nose). °MAKE SURE YOU:  °· Understand these instructions. °· Will watch your condition. °· Will get help right away if you are not doing well or get worse. °Document Released: 04/18/2005 Document Revised: 02/06/2013 Document Reviewed: 12/24/2012 °Exit[employer]® Patient Information ©2015 [employer]. This information is not intended to replace advice given to you by your health care provider. Make sure you discuss any questions you have with your health care provider. ° °

## 2014-08-03 NOTE — ED Notes (Signed)
Declined W/C at D/C and was escorted to lobby by RN. 

## 2014-08-05 LAB — CULTURE, GROUP A STREP: Strep A Culture: POSITIVE — AB

## 2014-08-06 ENCOUNTER — Telehealth: Payer: Self-pay | Admitting: Emergency Medicine

## 2014-08-06 NOTE — Progress Notes (Signed)
ED Antimicrobial Stewardship Positive Culture Follow Up   Samantha Nicholson is an 29 y.o. female who [facility] with a chief complaint of  Chief Complaint  Patient presents with  . URI    Recent Results (from the past 720 hour(s))  Rapid strep screen     Status: None   Collection Time: 08/03/14 11:20 AM  Result Value Ref Range Status   Streptococcus, Group A Screen (Direct) NEGATIVE NEGATIVE Final    Comment: (NOTE) A Rapid Antigen test may result negative if the antigen level in the sample is below the detection level of this test. The FDA has not cleared this test as a stand-alone test therefore the rapid antigen negative result has reflexed to a Group A Strep culture.   Culture, Group A Strep     Status: Abnormal   Collection Time: 08/03/14 11:20 AM  Result Value Ref Range Status   Strep A Culture Positive (A)  Final    Comment: (NOTE) Penicillin and ampicillin are drugs of choice for treatment of beta-hemolytic streptococcal infections. Susceptibility testing of penicillins and other beta-lactam agents approved by the FDA for treatment of beta-hemolytic streptococcal infections need not be performed routinely because nonsusceptible isolates are extremely rare in any beta-hemolytic streptococcus and have not been reported for Streptococcus pyogenes (group A). (CLSI 2011) Performed At: [facility]  [address] [place], [place] [id number] Mila HomerHancock William F MD WU:[id number]:[phone]    [x]  Patient discharged originally without antimicrobial agent and treatment is now indicated  [age] yoF with URI symptoms, daughter with strep. Patient was afebrile and rapid strep test was negative.   New antibiotic prescription: Amoxicillin 500 mg BID X 10 days  ED Provider: Ladona MowJoe Mintz, PA-C   Noelani Harbach, Suzan SlickAshley N 08/06/2014, 10:03 AM Infectious Diseases Pharmacist Phone# [phone]

## 2014-08-06 NOTE — Telephone Encounter (Signed)
Post ED Visit - Positive Culture Follow-up: Successful Patient Follow-Up  Culture assessed and recommendations reviewed by: []  Wes Dulaney, Pharm.D., BCPS []  Celedonio MiyamotoJeremy Frens, [address] BCPS []  Georgina PillionElizabeth Martin, Pharm.D., BCPS []  PortalMinh Pham, [address]Ph[address] BCPS, AAHIVP []  Estella HuskMichelle Turner, Pharm.D., BCPS, AAHIVP []  Red ChristiansSamson Lee, Pharm.D. [x]  Russ HaloAshley McCallister, [address]  Positive group A strep culture  [x]  Patient discharged without antimicrobial prescription and treatment is now indicated []  Organism is resistant to prescribed ED discharge antimicrobial []  Patient with positive blood cultures  Changes discussed with ED provider: Ladona MowJoe Mintz PA New antibiotic prescription Amoxicillin 500 mg PO BID x 10 days [facility] [phone]  Contacted patient, date 08/06/14, time 1241 ID verified, Pt notified of positive group A strep and need for antibiotic treatment. [facility] [phone].   Jiles HaroldGammons, Reily Treloar Chaney 08/06/2014, 12:45 PM

## 2015-10-02 DIAGNOSIS — Z3046 Encounter for surveillance of implantable subdermal contraceptive: Secondary | ICD-10-CM | POA: Diagnosis not present

## 2016-03-08 ENCOUNTER — Ambulatory Visit (HOSPITAL_COMMUNITY)
Admission: EM | Admit: 2016-03-08 | Discharge: 2016-03-08 | Disposition: A | Discharge status: Home / Self Care | Payer: BLUE CROSS/BLUE SHIELD | Attending: Family Medicine | Admitting: Family Medicine

## 2016-03-08 ENCOUNTER — Encounter (HOSPITAL_COMMUNITY): Payer: Self-pay | Admitting: Emergency Medicine

## 2016-03-08 DIAGNOSIS — B354 Tinea corporis: Secondary | ICD-10-CM | POA: Diagnosis not present

## 2016-03-08 MED ORDER — TERBINAFINE HCL 250 MG PO TABS: 250.0000 mg | ORAL_TABLET | Freq: Every day | ORAL | 0 refills | Status: DC

## 2016-03-08 NOTE — ED Triage Notes (Signed)
[facility] a [facility] of a rash on her arms and legs that have been present for 2 weeks. The patient reported that her son was recently diagnosed with ringworm and she felt she may have contracted it.  The patient also requested a wound check for a wound on her right forearm from where a cat scratched her.

## 2016-03-08 NOTE — ED Provider Notes (Signed)
[facility]    CSN: [id number] Arrival date & time: 03/08/16  1719     History   Chief Complaint Chief Complaint  Patient presents with  . Rash  . Wound Check    HPI Samantha Nicholson is a 30 y.o. female.   The history is provided by the patient.  Rash  Location:  Full body Quality: itchiness, redness and scaling   Severity:  Mild Onset quality:  Gradual Duration:  2 weeks Progression:  Spreading Chronicity:  New Context: sick contacts   Context comment:  Daughter with same.dx with ringworm. Relieved by:  None tried Ineffective treatments:  None tried Wound Check     History reviewed. No pertinent past medical history.  There are no active problems to display for this patient.   Past Surgical History:  Procedure Laterality Date  . MOUTH SURGERY    . TONSILLECTOMY      OB History    No data available       Home Medications    Prior to Admission medications   Medication Sig Start Date End Date Taking? Authorizing Provider  etonogestrel (NEXPLANON) 68 MG IMPL implant Inject 1 each into the skin once.   Yes Historical Provider, MD  HYDROcodone-acetaminophen (HYCET) 7.5-325 mg/15 ml solution Take 10 mLs by mouth 4 (four) times daily as needed for moderate pain or severe pain. 08/03/14   Trixie DredgeEmily West, PA-C  ibuprofen (ADVIL,MOTRIN) 800 MG tablet Take 1 tablet (800 mg total) by mouth every 8 (eight) hours as needed for mild pain or moderate pain. 08/03/14   Trixie DredgeEmily West, PA-C    Family History History reviewed. No pertinent family history.  Social History Social History  Substance Use Topics  . Smoking status: Never Smoker  . Smokeless tobacco: Never Used  . Alcohol use No     Allergies   Patient has no known allergies.   Review of Systems Review of Systems  Skin: Positive for rash and wound.  All other systems reviewed and are negative.    Physical Exam Triage Vital Signs ED Triage Vitals  Enc Vitals Group     BP 03/08/16 1753 153/93       Pulse Rate 03/08/16 1753 78     Resp 03/08/16 1753 18     Temp 03/08/16 1753 98.5 F (36.9 C)     Temp Source 03/08/16 1753 Oral     SpO2 03/08/16 1753 98 %     Weight --      Height --      Head Circumference --      Peak Flow --      Pain Score 03/08/16 1758 0     Pain Loc --      Pain Edu? --      Excl. in GC? --    No data found.   Updated Vital Signs BP 153/93 (BP Location: Left Arm)   Pulse 78   Temp 98.5 F (36.9 C) (Oral)   Resp 18   SpO2 98%   Visual Acuity Right Eye Distance:   Left Eye Distance:   Bilateral Distance:    Right Eye Near:   Left Eye Near:    Bilateral Near:     Physical Exam  Constitutional: She is oriented to person, place, and time. She appears well-developed and well-nourished.  Neurological: She is alert and oriented to person, place, and time.  Skin: Skin is warm and dry. Rash noted. No erythema.  Circular body lesions  Nursing  note and vitals reviewed.    UC Treatments / Results  Labs (all labs ordered are listed, but only abnormal results are displayed) Labs Reviewed - No data to display  EKG  EKG Interpretation None       Radiology No results found.  Procedures Procedures (including critical care time)  Medications Ordered in UC Medications - No data to display   Initial Impression / Assessment and Plan / UC Course  I have reviewed the triage vital signs and the nursing notes.  Pertinent labs & imaging results that were available during my care of the patient were reviewed by me and considered in my medical decision making (see chart for details).  Clinical Course       Final Clinical Impressions(s) / UC Diagnoses   Final diagnoses:  None    New Prescriptions New Prescriptions   No medications on file     Linna HoffJames D Kindl, MD 03/08/16 2022

## 2016-06-25 DIAGNOSIS — H40033 Anatomical narrow angle, bilateral: Secondary | ICD-10-CM | POA: Diagnosis not present

## 2016-06-25 DIAGNOSIS — H04123 Dry eye syndrome of bilateral lacrimal glands: Secondary | ICD-10-CM | POA: Diagnosis not present

## 2016-07-13 ENCOUNTER — Ambulatory Visit (INDEPENDENT_AMBULATORY_CARE_PROVIDER_SITE_OTHER): Payer: BLUE CROSS/BLUE SHIELD | Admitting: Emergency Medicine

## 2016-07-13 VITALS — BP 122/72 | HR 115 | Temp 101.0°F | Resp 17 | Ht 65.5 in | Wt 194.0 lb

## 2016-07-13 DIAGNOSIS — R5081 Fever presenting with conditions classified elsewhere: Secondary | ICD-10-CM

## 2016-07-13 DIAGNOSIS — R51 Headache: Secondary | ICD-10-CM

## 2016-07-13 DIAGNOSIS — R519 Headache, unspecified: Secondary | ICD-10-CM

## 2016-07-13 DIAGNOSIS — R509 Fever, unspecified: Secondary | ICD-10-CM | POA: Insufficient documentation

## 2016-07-13 DIAGNOSIS — M791 Myalgia, unspecified site: Secondary | ICD-10-CM

## 2016-07-13 DIAGNOSIS — J111 Influenza due to unidentified influenza virus with other respiratory manifestations: Secondary | ICD-10-CM | POA: Diagnosis not present

## 2016-07-13 MED ORDER — OSELTAMIVIR PHOSPHATE 75 MG PO CAPS: 75.0000 mg | ORAL_CAPSULE | Freq: Two times a day (BID) | ORAL | 0 refills | Status: AC

## 2016-07-13 MED ORDER — HYDROCODONE-ACETAMINOPHEN 5-325 MG PO TABS: 1.0000 | ORAL_TABLET | Freq: Four times a day (QID) | ORAL | 0 refills | Status: DC | PRN

## 2016-07-13 NOTE — Patient Instructions (Addendum)
     IF you received an x-ray today, you will receive an invoice from [facility]. Please contact [facility] at [phone] with questions or concerns regarding your invoice.   IF you received labwork today, you will receive an invoice from [facility]. Please contact [facility] at [phone] with questions or concerns regarding your invoice.   Our billing staff will not be able to assist you with questions regarding bills from these companies.  You will be contacted with the lab results as soon as they are available. The fastest way to get your results is to activate your My Chart account. Instructions are located on the last page of this paperwork. If you have not heard from us regarding the results in 2 weeks, please contact this office.      Influenza, Adult Influenza ("the flu") is an infection in the lungs, nose, and throat (respiratory tract). It is caused by a virus. The flu causes many common cold symptoms, as well as a high fever and body aches. It can make you feel very sick. The flu spreads easily from person to person (is contagious). Getting a flu shot (influenza vaccination) every year is the best way to prevent the flu. Follow these instructions at home:  Take over-the-counter and prescription medicines only as told by your doctor.  Use a cool mist humidifier to add moisture (humidity) to the air in your home. This can make it easier to breathe.  Rest as needed.  Drink enough fluid to keep your pee (urine) clear or pale yellow.  Cover your mouth and nose when you cough or sneeze.  Wash your hands with soap and water often, especially after you cough or sneeze. If you cannot use soap and water, use hand sanitizer.  Stay home from work or school as told by your doctor. Unless you are visiting your doctor, try to avoid leaving home until your fever has been gone for 24 hours without the use of medicine.  Keep all follow-up visits as told by your doctor.  This is important. How is this prevented?  Getting a yearly (annual) flu shot is the best way to avoid getting the flu. You may get the flu shot in late summer, fall, or winter. Ask your doctor when you should get your flu shot.  Wash your hands often or use hand sanitizer often.  Avoid contact with people who are sick during cold and flu season.  Eat healthy foods.  Drink plenty of fluids.  Get enough sleep.  Exercise regularly. Contact a doctor if:  You get new symptoms.  You have:  Chest pain.  Watery poop (diarrhea).  A fever.  Your cough gets worse.  You start to have more mucus.  You feel sick to your stomach (nauseous).  You throw up (vomit). Get help right away if:  You start to be short of breath or have trouble breathing.  Your skin or nails turn a bluish color.  You have very bad pain or stiffness in your neck.  You get a sudden headache.  You get sudden pain in your face or ear.  You cannot stop throwing up. This information is not intended to replace advice given to you by your health care provider. Make sure you discuss any questions you have with your health care provider. Document Released: 01/26/2008 Document Revised: 09/24/2015 Document Reviewed: 02/10/2015 Elsevier Interactive Patient Education  2017 Elsevier Inc.  

## 2016-07-13 NOTE — Progress Notes (Signed)
Samantha Nicholson 31 y.o.   Chief Complaint  Patient presents with  . Headache  . Chills  . Cough    HISTORY OF PRESENT ILLNESS: This is a 31 y.o. female complaining of flu symptoms that started fast and acutely today.  Influenza  This is a new problem. The current episode started today. The problem occurs constantly. The problem has been rapidly worsening. Associated symptoms include chills, a fever, headaches, myalgias and nausea. Pertinent negatives include no abdominal pain, chest pain, congestion, coughing, neck pain, rash, sore throat, urinary symptoms, vertigo or vomiting. Nothing aggravates the symptoms. Treatments tried: aspirin. The treatment provided no relief.     Prior to Admission medications   Medication Sig Start Date End Date Taking? Authorizing Provider  etonogestrel (NEXPLANON) 68 MG IMPL implant Inject 1 each into the skin once.   Yes Historical Provider, MD  HYDROcodone-acetaminophen (NORCO) 5-325 MG tablet Take 1 tablet by mouth every 6 (six) hours as needed for moderate pain. 07/13/16   Ha Placeres Victorino December, MD  oseltamivir (TAMIFLU) 75 MG capsule Take 1 capsule (75 mg total) by mouth 2 (two) times daily. 07/13/16 07/18/16  Georgina Quint, MD    No Known Allergies  Patient Active Problem List   Diagnosis Date Noted  . Influenza with respiratory manifestation other than pneumonia 07/13/2016  . Fever 07/13/2016  . Myalgia 07/13/2016  . Acute nonintractable headache 07/13/2016    No past medical history on file.  Past Surgical History:  Procedure Laterality Date  . MOUTH SURGERY    . TONSILLECTOMY      Social History   Social History  . Marital status: Single    Spouse name: N/A  . Number of children: N/A  . Years of education: N/A   Occupational History  . Not on file.   Social History Main Topics  . Smoking status: Never Smoker  . Smokeless tobacco: Never Used  . Alcohol use No  . Drug use: No  . Sexual activity: Yes    Birth control/  protection: Implant   Other Topics Concern  . Not on file   Social History Narrative  . No narrative on file    No family history on file.   Review of Systems  Constitutional: Positive for chills and fever.  HENT: Negative for congestion, ear discharge, nosebleeds, sinus pain and sore throat.   Eyes: Negative for blurred vision, double vision, discharge and redness.  Respiratory: Negative for cough, shortness of breath and wheezing.   Cardiovascular: Negative for chest pain and palpitations.  Gastrointestinal: Positive for nausea. Negative for abdominal pain, diarrhea and vomiting.  Genitourinary: Negative for dysuria, frequency and hematuria.  Musculoskeletal: Positive for myalgias. Negative for neck pain.  Skin: Negative for rash.  Neurological: Positive for headaches. Negative for dizziness and vertigo.  All other systems reviewed and are negative.   Vitals:   07/13/16 1552  BP: 122/72  Pulse: (!) 115  Resp: 17  Temp: (!) 101 F (38.3 C)    Physical Exam  Constitutional: She is oriented to person, place, and time. She appears well-developed and well-nourished.  HENT:  Head: Normocephalic and atraumatic.  Nose: Nose normal.  Mouth/Throat: Oropharynx is clear and moist. No oropharyngeal exudate.  Eyes: Conjunctivae and EOM are normal. Pupils are equal, round, and reactive to light.  Neck: Normal range of motion. Neck supple. No JVD present. No thyromegaly present.  Cardiovascular: Normal rate, regular rhythm and normal heart sounds.   Pulmonary/Chest: Effort normal and breath sounds  normal.  Abdominal: Soft. Bowel sounds are normal. There is no tenderness.  Musculoskeletal: Normal range of motion.  Lymphadenopathy:    She has no cervical adenopathy.  Neurological: She is alert and oriented to person, place, and time. No sensory deficit. She exhibits normal muscle tone.  Skin: Skin is warm and dry. Capillary refill takes less than 2 seconds. No rash noted.    Psychiatric: She has a normal mood and affect. Her behavior is normal.  Vitals reviewed.    ASSESSMENT & PLAN: Lene was seen today for headache, chills and cough.  Diagnoses and all orders for this visit:  Influenza with respiratory manifestation other than pneumonia  Fever in other diseases  Myalgia  Acute nonintractable headache, unspecified headache type  Other orders -     oseltamivir (TAMIFLU) 75 MG capsule; Take 1 capsule (75 mg total) by mouth 2 (two) times daily. -     HYDROcodone-acetaminophen (NORCO) 5-325 MG tablet; Take 1 tablet by mouth every 6 (six) hours as needed for moderate pain.    Patient Instructions       IF you received an x-ray today, you will receive an invoice from [facility]. Please contact [facility] at [phone] with questions or concerns regarding your invoice.   IF you received labwork today, you will receive an invoice from [facility]. Please contact [facility] at [phone] with questions or concerns regarding your invoice.   Our billing staff will not be able to assist you with questions regarding bills from these companies.  You will be contacted with the lab results as soon as they are available. The fastest way to get your results is to activate your My Chart account. Instructions are located on the last page of this paperwork. If you have not heard from [place] regarding the results in 2 weeks, please contact this office.      Influenza, Adult Influenza ("the flu") is an infection in the lungs, nose, and throat (respiratory tract). It is caused by a virus. The flu causes many common cold symptoms, as well as a high fever and body aches. It can make you feel very sick. The flu spreads easily from person to person (is contagious). Getting a flu shot (influenza vaccination) every year is the best way to prevent the flu. Follow these instructions at home:  Take over-the-counter and prescription medicines only as told  by your doctor.  Use a cool mist humidifier to add moisture (humidity) to the air in your home. This can make it easier to breathe.  Rest as needed.  Drink enough fluid to keep your pee (urine) clear or pale yellow.  Cover your mouth and nose when you cough or sneeze.  Wash your hands with soap and water often, especially after you cough or sneeze. If you cannot use soap and water, use hand sanitizer.  Stay home from work or school as told by your doctor. Unless you are visiting your doctor, try to avoid leaving home until your fever has been gone for 24 hours without the use of medicine.  Keep all follow-up visits as told by your doctor. This is important. How is this prevented?  Getting a yearly (annual) flu shot is the best way to avoid getting the flu. You may get the flu shot in late summer, fall, or winter. Ask your doctor when you should get your flu shot.  Wash your hands often or use hand sanitizer often.  Avoid contact with people who are sick during cold and flu  season.  Eat healthy foods.  Drink plenty of fluids.  Get enough sleep.  Exercise regularly. Contact a doctor if:  You get new symptoms.  You have:  Chest pain.  Watery poop (diarrhea).  A fever.  Your cough gets worse.  You start to have more mucus.  You feel sick to your stomach (nauseous).  You throw up (vomit). Get help right away if:  You start to be short of breath or have trouble breathing.  Your skin or nails turn a bluish color.  You have very bad pain or stiffness in your neck.  You get a sudden headache.  You get sudden pain in your face or ear.  You cannot stop throwing up. This information is not intended to replace advice given to you by your health care provider. Make sure you discuss any questions you have with your health care provider. Document Released: 01/26/2008 Document Revised: 09/24/2015 Document Reviewed: 02/10/2015 Elsevier Interactive Patient Education   2017 Elsevier Inc.      Edwina BarthMiguel Ludwika Rodd, MD [facility]

## 2016-10-12 DIAGNOSIS — R062 Wheezing: Secondary | ICD-10-CM | POA: Diagnosis not present

## 2016-10-12 DIAGNOSIS — Z6833 Body mass index (BMI) 33.0-33.9, adult: Secondary | ICD-10-CM | POA: Diagnosis not present

## 2016-10-12 DIAGNOSIS — J019 Acute sinusitis, unspecified: Secondary | ICD-10-CM | POA: Diagnosis not present

## 2016-11-08 ENCOUNTER — Ambulatory Visit (INDEPENDENT_AMBULATORY_CARE_PROVIDER_SITE_OTHER): Payer: BLUE CROSS/BLUE SHIELD | Admitting: Emergency Medicine

## 2016-11-08 ENCOUNTER — Encounter: Payer: Self-pay | Admitting: Emergency Medicine

## 2016-11-08 VITALS — BP 110/80 | HR 95 | Temp 98.4°F | Resp 16 | Ht 64.0 in | Wt 189.4 lb

## 2016-11-08 DIAGNOSIS — H9201 Otalgia, right ear: Secondary | ICD-10-CM

## 2016-11-08 DIAGNOSIS — H6691 Otitis media, unspecified, right ear: Secondary | ICD-10-CM | POA: Insufficient documentation

## 2016-11-08 MED ORDER — AMOXICILLIN-POT CLAVULANATE 875-125 MG PO TABS: 1.0000 | ORAL_TABLET | Freq: Two times a day (BID) | ORAL | 0 refills | Status: DC

## 2016-11-08 NOTE — Patient Instructions (Addendum)
pc    IF you received an x-ray today, you will [facility] an [facility]. Please contact [facility] at [phone] with questions or concerns regarding your invoice.   IF you received labwork today, you will receive an invoice from [facility]. Please contact [facility] at [phone] with questions or concerns regarding your invoice.   Our billing staff will not be able to assist you with questions regarding bills from these companies.  You will be contacted with the lab results as soon as they are available. The fastest way to get your results is to activate your My Chart account. Instructions are located on the last page of this paperwork. If you have not heard from us regarding the results in 2 weeks, please contact this office.     Ear Drainage Ear drainage means that ear wax, pus, blood, or other fluid comes out of the ear (discharge). Follow these instructions at home: Pay attention to any changes in your ear drainage. Take these actions to help with your condition:  Take over-the-counter and prescription medicines only as told by your doctor.  Do not use cotton-tipped swabs in your ear. Do not put any other objects in your ear.  Do not swim until your doctor says it is okay.  Before you shower, cover a cotton ball with petroleum jelly and put that in your ear. This helps to keep water out of your ear.  Avoid being around smoke.  Wash your hands before and after you touch your ears.  Keep all follow-up visits as told by your doctor. This is important.  Contact a doctor if:  You have more drainage.  You have ear pain.  You have a fever.  Your drainage is not getting better with treatment.  Your ear drainage is bloody, white, clear, or yellow.  Your ear is red or swollen. Get help right away if:  You have very bad ear pain.  You have a very bad headache.  You throw up (vomit).  You feel dizzy.  You have a seizure.  You have new  hearing loss. This information is not intended to replace advice given to you by your health care provider. Make sure you discuss any questions you have with your health care provider. Document Released: 10/06/2009 Document Revised: 09/24/2015 Document Reviewed: 07/22/2014 Elsevier Interactive Patient Education  [employer]2018 Elsevier Inc.

## 2016-11-08 NOTE — Progress Notes (Signed)
Samantha Nicholson 31 y.o.   Chief Complaint  Patient presents with  . Cough  . Ear Problem    RIGHT - with drainage and feel stuffy x 3 days    HISTORY OF PRESENT ILLNESS: This is a 31 y.o. female complaining of right ear stuffiness and pressure x 3 days; no other significant symptoms.  HPI   Prior to Admission medications   Medication Sig Start Date End Date Taking? Authorizing Provider  etonogestrel (NEXPLANON) 68 MG IMPL implant Inject 1 each into the skin once.   Yes [provider]  HYDROcodone-acetaminophen (NORCO) 5-325 MG tablet Take 1 tablet by mouth every 6 (six) hours as needed for moderate pain. Patient not taking: Reported on 11/08/2016 07/13/16   Samantha Quint, MD    No Known Allergies  Patient Active Problem List   Diagnosis Date Noted  . Influenza with respiratory manifestation other than pneumonia 07/13/2016  . Fever 07/13/2016  . Myalgia 07/13/2016  . Acute nonintractable headache 07/13/2016    Past Medical History:  Diagnosis Date  . Asthma     Past Surgical History:  Procedure Laterality Date  . MOUTH SURGERY    . TONSILLECTOMY      Social History   Social History  . Marital status: Single    Spouse name: N/A  . Number of children: N/A  . Years of education: N/A   Occupational History  . Not on file.   Social History Main Topics  . Smoking status: Never Smoker  . Smokeless tobacco: Never Used  . Alcohol use No  . Drug use: No  . Sexual activity: Yes    Birth control/ protection: Implant   Other Topics Concern  . Not on file   Social History Narrative  . No narrative on file    Family History  Problem Relation Age of Onset  . Heart disease Father   . Kidney disease Father   . Diabetes Maternal Grandmother      Review of Systems  Constitutional: Negative.  Negative for chills and fever.  HENT: Positive for ear discharge and ear pain. Negative for congestion, nosebleeds, sinus pain and sore throat.   Eyes:  Negative.   Respiratory: Positive for cough. Negative for sputum production, shortness of breath and wheezing.   Cardiovascular: Negative.  Negative for chest pain and palpitations.  Gastrointestinal: Negative for abdominal pain, diarrhea, nausea and vomiting.  Genitourinary: Negative for dysuria.  Musculoskeletal: Negative for back pain, myalgias and neck pain.  Neurological: Negative for dizziness and headaches.  Endo/Heme/Allergies: Negative.   All other systems reviewed and are negative.   Vitals:   11/08/16 1221  BP: 110/80  Pulse: 95  Resp: 16  Temp: 98.4 F (36.9 C)    Physical Exam  Constitutional: She is oriented to person, place, and time. She appears well-developed and well-nourished.  HENT:  Head: Normocephalic and atraumatic.  Right Ear: External ear normal. Tympanic membrane is injected.  Left Ear: Hearing, tympanic membrane, external ear and ear canal normal.  Eyes: Conjunctivae and EOM are normal. Pupils are equal, round, and reactive to light.  Neck: Normal range of motion. Neck supple.  Cardiovascular: Normal rate, regular rhythm, normal heart sounds and intact distal pulses.   Pulmonary/Chest: Effort normal and breath sounds normal.  Musculoskeletal: Normal range of motion.  Neurological: She is alert and oriented to person, place, and time. No sensory deficit. She exhibits normal muscle tone.  Skin: Skin is warm and dry. No rash noted.  Psychiatric:  She has a normal mood and affect. Her behavior is normal.  Vitals reviewed.    ASSESSMENT & PLAN: Samantha Nicholson was seen today for cough and ear problem.  Diagnoses and all orders for this visit:  Right otitis media, unspecified otitis media type  Otalgia of right ear  Other orders -     amoxicillin-clavulanate (AUGMENTIN) 875-125 MG tablet; Take 1 tablet by mouth 2 (two) times daily.    Patient Instructions   pc    IF you received an x-ray today, you will receive an invoice from [facility].  Please contact [facility] at [phone] with questions or concerns regarding your invoice.   IF you received labwork today, you will receive an invoice from [facility]. Please contact [facility] at [phone] with questions or concerns regarding your invoice.   Our billing staff will not be able to assist you with questions regarding bills from these companies.  You will be contacted with the lab results as soon as they are available. The fastest way to get your results is to activate your My Chart account. Instructions are located on the last page of this paperwork. If you have not heard from us regarding the results in 2 weeks, please contact this office.     Ear Drainage Ear drainage means that ear wax, pus, blood, or other fluid comes out of the ear (discharge). Follow these instructions at home: Pay attention to any changes in your ear drainage. Take these actions to help with your condition:  Take over-the-counter and prescription medicines only as told by your doctor.  Do not use cotton-tipped swabs in your ear. Do not put any other objects in your ear.  Do not swim until your doctor says it is okay.  Before you shower, cover a cotton ball with petroleum jelly and put that in your ear. This helps to keep water out of your ear.  Avoid being around smoke.  Wash your hands before and after you touch your ears.  Keep all follow-up visits as told by your doctor. This is important.  Contact a doctor if:  You have more drainage.  You have ear pain.  You have a fever.  Your drainage is not getting better with treatment.  Your ear drainage is bloody, white, clear, or yellow.  Your ear is red or swollen. Get help right away if:  You have very bad ear pain.  You have a very bad headache.  You throw up (vomit).  You feel dizzy.  You have a seizure.  You have new hearing loss. This information is not intended to replace advice given to you by your health  care provider. Make sure you discuss any questions you have with your health care provider. Document Released: 10/06/2009 Document Revised: 09/24/2015 Document Reviewed: 07/22/2014 Elsevier Interactive Patient Education  2018 Elsevier Inc.      Edwina BarthMiguel Teddy Rebstock, MD [facility]

## 2016-11-14 ENCOUNTER — Encounter: Payer: Self-pay | Admitting: Emergency Medicine

## 2016-11-14 ENCOUNTER — Telehealth: Payer: Self-pay | Admitting: Emergency Medicine

## 2016-11-14 NOTE — Telephone Encounter (Signed)
Pt states she had a recent visit and received rx for ear issue but issues have not resolved. Please call to advise [phone]

## 2016-11-15 ENCOUNTER — Ambulatory Visit (INDEPENDENT_AMBULATORY_CARE_PROVIDER_SITE_OTHER): Payer: BLUE CROSS/BLUE SHIELD | Admitting: Family Medicine

## 2016-11-15 ENCOUNTER — Encounter: Payer: Self-pay | Admitting: Family Medicine

## 2016-11-15 VITALS — BP 122/75 | HR 77 | Temp 98.8°F | Resp 17 | Ht 65.0 in | Wt 193.0 lb

## 2016-11-15 DIAGNOSIS — H6691 Otitis media, unspecified, right ear: Secondary | ICD-10-CM

## 2016-11-15 DIAGNOSIS — H9201 Otalgia, right ear: Secondary | ICD-10-CM

## 2016-11-15 MED ORDER — CEFDINIR 300 MG PO CAPS: 300.0000 mg | ORAL_CAPSULE | Freq: Two times a day (BID) | ORAL | 0 refills | Status: AC

## 2016-11-15 MED ORDER — CIPROFLOXACIN-HYDROCORTISONE 0.2-1 % OT SUSP: 3.0000 [drp] | Freq: Two times a day (BID) | OTIC | 0 refills | Status: DC

## 2016-11-15 MED ORDER — PREDNISONE 20 MG PO TABS: 40.0000 mg | ORAL_TABLET | Freq: Every day | ORAL | 0 refills | Status: AC

## 2016-11-15 NOTE — Progress Notes (Signed)
  Chief Complaint  Patient presents with  . Ear Pain    HPI   Pt is here for follow up for ear pain on the right She states that she has been having pain continuously and completed augmentin without improvement She denies fevers or chills She has not nausea Just some dizziness   Past Medical History:  Diagnosis Date  . Asthma     Current Outpatient Prescriptions  Medication Sig Dispense Refill  . etonogestrel (NEXPLANON) 68 MG IMPL implant Inject 1 each into the skin once.    . cefdinir (OMNICEF) 300 MG capsule Take 1 capsule (300 mg total) by mouth 2 (two) times daily. 20 capsule 0  . ciprofloxacin-hydrocortisone (CIPRO HC) OTIC suspension Place 3 drops into both ears 2 (two) times daily. 10 mL 0  . predniSONE (DELTASONE) 20 MG tablet Take 2 tablets (40 mg total) by mouth daily with breakfast. 14 tablet 0   No current facility-administered medications for this visit.     Allergies: No Known Allergies  Past Surgical History:  Procedure Laterality Date  . MOUTH SURGERY    . TONSILLECTOMY      Social History   Social History  . Marital status: Single    Spouse name: N/A  . Number of children: N/A  . Years of education: N/A   Social History Main Topics  . Smoking status: Never Smoker  . Smokeless tobacco: Never Used  . Alcohol use No  . Drug use: No  . Sexual activity: Yes    Birth control/ protection: Implant   Other Topics Concern  . None   Social History Narrative  . None    ROS See hpi  Objective: Vitals:   11/15/16 1741  BP: 122/75  Pulse: 77  Resp: 17  Temp: 98.8 F (37.1 C)  TempSrc: Oral  SpO2: 98%  Weight: 193 lb (87.5 kg)  Height: 5\' 5"  (1.651 m)    Physical Exam General: alert, oriented, in NAD Head: normocephalic, atraumatic, no sinus tenderness Eyes: EOM intact, no scleral icterus or conjunctival injection Ears: right ear with bulging TM and purulence noted, TMJ tender on the right TM clear on the left Nose: mucosa  nonerythematous, nonedematous Throat: no pharyngeal exudate or erythema Lymph: no posterior auricular, submental or cervical lymph adenopathy Heart: normal rate, normal sinus rhythm, no murmurs Lungs: clear to auscultation bilaterally, no wheezing   Assessment and Plan [name] was seen today for ear pain.  Diagnoses and all orders for this visit:  Right otitis media, unspecified otitis media type Otalgia of right ear  Other orders -     predniSONE (DELTASONE) 20 MG tablet; Take 2 tablets (40 mg total) by mouth daily with breakfast. -     cefdinir (OMNICEF) 300 MG capsule; Take 1 capsule (300 mg total) by mouth 2 (two) times daily. -     ciprofloxacin-hydrocortisone (CIPRO HC) OTIC suspension; Place 3 drops into both ears 2 (two) times daily.     Yavonne Kiss A Durinda Buzzelli

## 2016-11-15 NOTE — Patient Instructions (Addendum)
   IF you received an x-ray today, you will receive an invoice from [facility]. Please contact [facility] at [phone] with questions or concerns regarding your invoice.   IF you received labwork today, you will receive an invoice from [facility]. Please contact [facility] at [phone] with questions or concerns regarding your invoice.   Our billing staff will not be able to assist you with questions regarding bills from these companies.  You will be contacted with the lab results as soon as they are available. The fastest way to get your results is to activate your My Chart account. Instructions are located on the last page of this paperwork. If you have not heard from us regarding the results in 2 weeks, please contact this office.     Otitis Media, Adult Otitis media occurs when there is inflammation and fluid in the middle ear. Your middle ear is a part of the ear that contains bones for hearing as well as air that helps send sounds to your brain. What are the causes? This condition is caused by a blockage in the eustachian tube. This tube drains fluid from the ear to the back of the nose (nasopharynx). A blockage in this tube can be caused by an object or by swelling (edema) in the tube. Problems that can cause a blockage include:  A cold or other upper respiratory infection.  Allergies.  An irritant, such as tobacco smoke.  Enlarged adenoids. The adenoids are areas of soft tissue located high in the back of the throat, behind the nose and the roof of the mouth.  A mass in the nasopharynx.  Damage to the ear caused by pressure changes (barotrauma).  What are the signs or symptoms? Symptoms of this condition include:  Ear pain.  A fever.  Decreased hearing.  A headache.  Tiredness (lethargy).  Fluid leaking from the ear.  Ringing in the ear.  How is this diagnosed? This condition is diagnosed with a physical exam. During the exam your health  care provider will use an instrument called an otoscope to look into your ear and check for redness, swelling, and fluid. He or she will also ask about your symptoms. Your health care provider may also order tests, such as:  A test to check the movement of the eardrum (pneumatic otoscopy). This test is done by squeezing a small amount of air into the ear.  A test that changes air pressure in the middle ear to check how well the eardrum moves and whether the eustachian tube is working (tympanogram).  How is this treated? This condition usually goes away on its own within 3-5 days. But if the condition is caused by a bacteria infection and does not go away own its own, or keeps coming back, your health care provider may:  Prescribe antibiotic medicines to treat the infection.  Prescribe or recommend medicines to control pain.  Follow these instructions at home:  Take over-the-counter and prescription medicines only as told by your health care provider.  If you were prescribed an antibiotic medicine, take it as told by your health care provider. Do not stop taking the antibiotic even if you start to feel better.  Keep all follow-up visits as told by your health care provider. This is important. Contact a health care provider if:  You have bleeding from your nose.  There is a lump on your neck.  You are not getting better in 5 days.  You feel worse instead of better.   Get help right away if:  You have severe pain that is not controlled with medicine.  You have swelling, redness, or pain around your ear.  You have stiffness in your neck.  A part of your face is paralyzed.  The bone behind your ear (mastoid) is tender when you touch it.  You develop a severe headache. Summary  Otitis media is redness, soreness, and swelling of the middle ear.  This condition usually goes away on its own within 3-5 days.  If the problem does not go away in 3-5 days, your health care provider  may prescribe or recommend medicines to treat your symptoms.  If you were prescribed an antibiotic medicine, take it as told by your health care provider. This information is not intended to replace advice given to you by your health care provider. Make sure you discuss any questions you have with your health care provider. Document Released: 01/22/2004 Document Revised: 04/08/2016 Document Reviewed: 04/08/2016 Elsevier Interactive Patient Education  2017 Elsevier Inc.  

## 2016-11-15 NOTE — Telephone Encounter (Signed)
/  Spoke with patient and she stated that she made an appointment today (11/15/16).

## 2016-11-22 ENCOUNTER — Ambulatory Visit: Payer: BLUE CROSS/BLUE SHIELD | Admitting: Family Medicine

## 2016-11-23 ENCOUNTER — Ambulatory Visit (INDEPENDENT_AMBULATORY_CARE_PROVIDER_SITE_OTHER): Payer: BLUE CROSS/BLUE SHIELD | Admitting: Family Medicine

## 2016-11-23 ENCOUNTER — Encounter: Payer: Self-pay | Admitting: Family Medicine

## 2016-11-23 VITALS — BP 128/84 | HR 87 | Temp 98.1°F | Resp 17 | Ht 65.0 in | Wt 193.4 lb

## 2016-11-23 DIAGNOSIS — H6691 Otitis media, unspecified, right ear: Secondary | ICD-10-CM | POA: Diagnosis not present

## 2016-11-23 DIAGNOSIS — H9201 Otalgia, right ear: Secondary | ICD-10-CM | POA: Diagnosis not present

## 2016-11-23 MED ORDER — FLUTICASONE PROPIONATE 50 MCG/ACT NA SUSP: 2.0000 | Freq: Every day | NASAL | 6 refills | Status: DC

## 2016-11-23 NOTE — Patient Instructions (Signed)
     IF you received an x-ray today, you will receive an invoice from [facility]. Please contact [facility] at [phone] with questions or concerns regarding your invoice.   IF you received labwork today, you will receive an invoice from [facility]. Please contact [facility] at [phone] with questions or concerns regarding your invoice.   Our billing staff will not be able to assist you with questions regarding bills from these companies.  You will be contacted with the lab results as soon as they are available. The fastest way to get your results is to activate your My Chart account. Instructions are located on the last page of this paperwork. If you have not heard from us regarding the results in 2 weeks, please contact this office.     

## 2016-11-23 NOTE — Progress Notes (Signed)
  Chief Complaint  Patient presents with  . Follow-up    right otitis media, better but sharp pain behind both ears, and stuffiness in ears and popping    HPI Pt here to follow up  She continues to have slight pain behind her right ear The ear pain is improving now that she has completed her prednisone She states that she was much better but with the weather changes she felt pressure in her ears.   Past Medical History:  Diagnosis Date  . Asthma     Current Outpatient Prescriptions  Medication Sig Dispense Refill  . cefdinir (OMNICEF) 300 MG capsule Take 1 capsule (300 mg total) by mouth 2 (two) times daily. 20 capsule 0  . ciprofloxacin-hydrocortisone (CIPRO HC) OTIC suspension Place 3 drops into both ears 2 (two) times daily. 10 mL 0  . etonogestrel (NEXPLANON) 68 MG IMPL implant Inject 1 each into the skin once.    . fluticasone (FLONASE) 50 MCG/ACT nasal spray Place 2 sprays into both nostrils daily. 16 g 6   No current facility-administered medications for this visit.     Allergies: No Known Allergies  Past Surgical History:  Procedure Laterality Date  . MOUTH SURGERY    . TONSILLECTOMY      Social History   Social History  . Marital status: Single    Spouse name: N/A  . Number of children: N/A  . Years of education: N/A   Social History Main Topics  . Smoking status: Never Smoker  . Smokeless tobacco: Never Used  . Alcohol use No  . Drug use: No  . Sexual activity: Yes    Birth control/ protection: Implant   Other Topics Concern  . None   Social History Narrative  . None    ROS No fevers or chills No tinnitus No n/v No rash  Objective: Vitals:   11/23/16 1727  BP: 128/84  Pulse: 87  Resp: 17  Temp: 98.1 F (36.7 C)  TempSrc: Oral  SpO2: 96%  Weight: 193 lb 6.4 oz (87.7 kg)  Height: 5\' 5"  (1.651 m)    Physical Exam General: alert, oriented, in NAD Head: normocephalic, atraumatic, no sinus tenderness Eyes: EOM intact, no scleral  icterus or conjunctival injection Ears: TM clear on left, right TM clear with white residue from ear drops Nose: mucosa nonerythematous, nonedematous Throat: no pharyngeal exudate or erythema Lymph: no posterior auricular, submental or cervical lymph adenopathy Heart: normal rate, normal sinus rhythm, no murmurs Lungs: clear to auscultation bilaterally, no wheezing   Assessment and Plan [name] was seen today for follow-up.  Diagnoses and all orders for this visit:  Right otitis media, unspecified otitis media type  Otalgia of right ear Continue antibiotics  Add flonase for increased pressure in ears -     fluticasone (FLONASE) 50 MCG/ACT nasal spray; Place 2 sprays into both nostrils daily.     Randee Upchurch A Madalaine Portier

## 2017-04-20 ENCOUNTER — Ambulatory Visit: Payer: BLUE CROSS/BLUE SHIELD | Admitting: Physician Assistant

## 2017-04-20 ENCOUNTER — Encounter: Payer: Self-pay | Admitting: Physician Assistant

## 2017-04-20 VITALS — BP 138/80 | HR 95 | Temp 98.0°F | Resp 16 | Ht 65.75 in | Wt 194.0 lb

## 2017-04-20 DIAGNOSIS — R05 Cough: Secondary | ICD-10-CM | POA: Diagnosis not present

## 2017-04-20 DIAGNOSIS — R059 Cough, unspecified: Secondary | ICD-10-CM

## 2017-04-20 DIAGNOSIS — J069 Acute upper respiratory infection, unspecified: Secondary | ICD-10-CM

## 2017-04-20 DIAGNOSIS — J3489 Other specified disorders of nose and nasal sinuses: Secondary | ICD-10-CM | POA: Diagnosis not present

## 2017-04-20 MED ORDER — PSEUDOEPHEDRINE HCL 60 MG PO TABS: 60.0000 mg | ORAL_TABLET | Freq: Two times a day (BID) | ORAL | 0 refills | Status: DC

## 2017-04-20 MED ORDER — BENZONATATE 100 MG PO CAPS: 100.0000 mg | ORAL_CAPSULE | Freq: Three times a day (TID) | ORAL | 0 refills | Status: DC | PRN

## 2017-04-20 MED ORDER — HYDROCODONE-HOMATROPINE 5-1.5 MG/5ML PO SYRP: 5.0000 mL | ORAL_SOLUTION | Freq: Three times a day (TID) | ORAL | 0 refills | Status: DC | PRN

## 2017-04-20 NOTE — Patient Instructions (Addendum)
- We will treat this as a respiratory viral infection.  - I recommend you rest, drink plenty of fluids, eat light meals including soups.  - I recommend you use sudafed and flonase for nasal congestion.  - You may use cough syrup at night for your cough and sore throat, Tessalon pearls during the day. If you want to try and get things up, use mucinex, if you want to suppress cough, use tessalon perles. Be aware that cough syrup can definitely make you drowsy and sleepy so do not drive or operate any heavy machinery if it is affecting you during the day.  - Please let me know if you are not seeing any improvement or get worse in 3-5 days.    Upper Respiratory Infection, Adult Most upper respiratory infections (URIs) are caused by a virus. A URI affects the nose, throat, and upper air passages. The most common type of URI is often called "the common cold." Follow these instructions at home:  Take medicines only as told by your doctor.  Gargle warm saltwater or take cough drops to comfort your throat as told by your doctor.  Use a warm mist humidifier or inhale steam from a shower to increase air moisture. This may make it easier to breathe.  Drink enough fluid to keep your pee (urine) clear or pale yellow.  Eat soups and other clear broths.  Have a healthy diet.  Rest as needed.  Go back to work when your fever is gone or your doctor says it is okay. ? You may need to stay home longer to avoid giving your URI to others. ? You can also wear a face mask and wash your hands often to prevent spread of the virus.  Use your inhaler more if you have asthma.  Do not use any tobacco products, including cigarettes, chewing tobacco, or electronic cigarettes. If you need help quitting, ask your doctor. Contact a doctor if:  You are getting worse, not better.  Your symptoms are not helped by medicine.  You have chills.  You are getting more short of breath.  You have brown or red  mucus.  You have yellow or brown discharge from your nose.  You have pain in your face, especially when you bend forward.  You have a fever.  You have puffy (swollen) neck glands.  You have pain while swallowing.  You have white areas in the back of your throat. Get help right away if:  You have very bad or constant: ? Headache. ? Ear pain. ? Pain in your forehead, behind your eyes, and over your cheekbones (sinus pain). ? Chest pain.  You have long-lasting (chronic) lung disease and any of the following: ? Wheezing. ? Long-lasting cough. ? Coughing up blood. ? A change in your usual mucus.  You have a stiff neck.  You have changes in your: ? Vision. ? Hearing. ? Thinking. ? Mood. This information is not intended to replace advice given to you by your health care provider. Make sure you discuss any questions you have with your health care provider. Document Released: 10/05/2007 Document Revised: 12/20/2015 Document Reviewed: 07/24/2013 Elsevier Interactive Patient Education  2018 [employer]  IF you received an x-ray today, you will receive an invoice from [facility]. Please contact [facility] at [phone] with questions or concerns regarding your invoice.   IF you received labwork today, you will receive an invoice from [facility]. Please contact [facility] at [phone] with questions or concerns regarding your  invoice.   Our billing staff will not be able to assist you with questions regarding bills from these companies.  You will be contacted with the lab results as soon as they are available. The fastest way to get your results is to activate your My Chart account. Instructions are located on the last page of this paperwork. If you have not heard from [place] regarding the results in 2 weeks, please contact this office.

## 2017-04-20 NOTE — Progress Notes (Signed)
MRN: [id number] DOB: 25-Jun-1985  Subjective:   Samantha Nicholson is a 31 y.o. female presenting for chief complaint of Cough (x 1 week ) and Nasal Congestion (with some chest congestion) .  Reports 1 week history of runny nose, sneezing, ear fullness, sinus pressure, and dry hacking cough.  Has tried mucinex with no full relief. Cough is keeping her up at night. Denies fever, ear pain, wheezing, shortness of breath, chest pain and myalgia, night sweats, chills, fatigue, nausea, vomiting, abdominal pain and diarrhea. Has had sick contact with grandparents and roommate who were dx with URI. No history of seasonal allergies, has history of asthma. Patient has not had flu shot this season. Denies smoking.  Denies any other aggravating or relieving factors, no other questions or concerns.  [name] has a current medication list which includes the following prescription(s): etonogestrel and fluticasone. Also has No Known Allergies.  [name]  has a past medical history of Asthma. Also  has a past surgical history that includes Tonsillectomy and Mouth surgery.   Objective:   Vitals: BP 138/80   Pulse 95   Temp 98 F (36.7 C) (Oral)   Resp 16   Ht 5' 5.75" (1.67 m)   Wt 194 lb (88 kg)   SpO2 98%   BMI 31.55 kg/m   Physical Exam  Constitutional: She is oriented to person, place, and time. She appears well-developed and well-nourished. No distress.  HENT:  Head: Normocephalic and atraumatic.  Right Ear: Tympanic membrane is not erythematous and not bulging. A middle ear effusion is present.  Left Ear: Tympanic membrane is not erythematous and not bulging.  No middle ear effusion.  Nose: Mucosal edema (moderate on right, mild on left) and rhinorrhea present. Right sinus exhibits maxillary sinus tenderness (mild). Right sinus exhibits no frontal sinus tenderness. Left sinus exhibits maxillary sinus tenderness (mild). Left sinus exhibits no frontal sinus tenderness.  Mouth/Throat: Uvula is midline,  oropharynx is clear and moist and mucous membranes are normal. No tonsillar exudate.  Eyes: Conjunctivae are normal.  Neck: Normal range of motion.  Cardiovascular: Normal rate, regular rhythm and normal heart sounds.  Pulmonary/Chest: Effort normal and breath sounds normal. She has no wheezes. She has no rales.  Lymphadenopathy:       Head (right side): No submental, no submandibular, no tonsillar, no preauricular, no posterior auricular and no occipital adenopathy present.       Head (left side): No submental, no submandibular, no tonsillar, no preauricular, no posterior auricular and no occipital adenopathy present.    She has no cervical adenopathy.       Right: No supraclavicular adenopathy present.       Left: No supraclavicular adenopathy present.  Neurological: She is alert and oriented to person, place, and time.  Skin: Skin is warm and dry.  Psychiatric: She has a normal mood and affect.  Vitals reviewed.   No results found for this or any previous visit (from the past 24 hour(s)).  Assessment and Plan :  1. Rhinorrhea 2. Sinus pressure - pseudoephedrine (SUDAFED) 60 MG tablet; Take 1 tablet (60 mg total) by mouth 2 (two) times daily.  Dispense: 20 tablet; Refill: 0 3. Cough - benzonatate (TESSALON) 100 MG capsule; Take 1-2 capsules (100-200 mg total) by mouth 3 (three) times daily as needed for cough.  Dispense: 40 capsule; Refill: 0 - HYDROcodone-homatropine (HYCODAN) 5-1.5 MG/5ML syrup; Take 5 mLs by mouth every 8 (eight) hours as needed for cough.  Dispense: 120 mL;  Refill: 0 4. Acute upper respiratory infection History and physical exam findings consistent with acute URI.  Likely viral etiology.  Vital stable.  She is afebrile.  Lungs CTAB.  Will treat symptomatically at this time.  Advised to return to clinic if symptoms worsen, do not improve in 3-5 days, or as needed.  Benjiman CoreBrittany Harrietta Incorvaia, PA-C  Primary Care at [facility] 04/20/2017 5:35 PM

## 2017-08-09 ENCOUNTER — Encounter: Payer: Self-pay | Admitting: Physician Assistant

## 2017-11-06 ENCOUNTER — Ambulatory Visit: Payer: BLUE CROSS/BLUE SHIELD | Admitting: Family Medicine

## 2017-11-06 DIAGNOSIS — R112 Nausea with vomiting, unspecified: Secondary | ICD-10-CM | POA: Diagnosis not present

## 2017-11-06 DIAGNOSIS — Z6831 Body mass index (BMI) 31.0-31.9, adult: Secondary | ICD-10-CM | POA: Diagnosis not present

## 2017-11-06 DIAGNOSIS — R350 Frequency of micturition: Secondary | ICD-10-CM | POA: Diagnosis not present

## 2017-11-06 DIAGNOSIS — J029 Acute pharyngitis, unspecified: Secondary | ICD-10-CM | POA: Diagnosis not present

## 2017-11-06 DIAGNOSIS — R197 Diarrhea, unspecified: Secondary | ICD-10-CM | POA: Diagnosis not present

## 2018-01-11 ENCOUNTER — Other Ambulatory Visit: Payer: Self-pay | Admitting: Family Medicine

## 2018-02-07 ENCOUNTER — Ambulatory Visit: Payer: Self-pay | Admitting: Family Medicine

## 2018-02-07 NOTE — Telephone Encounter (Signed)
Patient has had cough and congestion for 1-2 weeks- she states she has been using allergy medication to help with symptoms. Patient states the cough is bad enough that she has chest pain and some breathing problems at times- she has to take deeper breaths. Patient cough is worse at night. Patient works and can only come after work- she is requesting late appointment- call to office- OK to schedule per Steward Drone.  Reason for Disposition . Wheezing is present  Answer Assessment - Initial Assessment Questions 1. ONSET: "When did the cough begin?"      1-2 weeks ago 2. SEVERITY: "How bad is the cough today?"      Cough is causing pain in the chest 3. RESPIRATORY DISTRESS: "Describe your breathing."      Patient states at times she is breathing heavier 4. FEVER: "Do you have a fever?" If so, ask: "What is your temperature, how was it measured, and when did it start?"     no 5. SPUTUM: "Describe the color of your sputum" (clear, white, yellow, green)     clear 6. HEMOPTYSIS: "Are you coughing up any blood?" If so ask: "How much?" (flecks, streaks, tablespoons, etc.)     no 7. CARDIAC HISTORY: "Do you have any history of heart disease?" (e.g., heart attack, congestive heart failure)      no 8. LUNG HISTORY: "Do you have any history of lung disease?"  (e.g., pulmonary embolus, asthma, emphysema)     Early stages of asthma 9. PE RISK FACTORS: "Do you have a history of blood clots?" (or: recent major surgery, recent prolonged travel, bedridden)     no 10. OTHER SYMPTOMS: "Do you have any other symptoms?" (e.g., runny nose, wheezing, chest pain)       Congestion in chest, pain in chest, little wheezing 11. PREGNANCY: "Is there any chance you are pregnant?" "When was your last menstrual period?"       No- LMP- Nexplanon 12. TRAVEL: "Have you traveled out of the country in the last month?" (e.g., travel history, exposures)       no  Protocols used: COUGH - ACUTE PRODUCTIVE-A-AH

## 2018-02-08 ENCOUNTER — Ambulatory Visit: Payer: BLUE CROSS/BLUE SHIELD | Admitting: Family Medicine

## 2018-02-08 ENCOUNTER — Encounter: Payer: Self-pay | Admitting: Family Medicine

## 2018-02-08 ENCOUNTER — Other Ambulatory Visit: Payer: Self-pay

## 2018-02-08 VITALS — BP 132/80 | HR 83 | Temp 97.9°F | Ht 64.0 in | Wt 202.0 lb

## 2018-02-08 DIAGNOSIS — J189 Pneumonia, unspecified organism: Secondary | ICD-10-CM

## 2018-02-08 DIAGNOSIS — R059 Cough, unspecified: Secondary | ICD-10-CM

## 2018-02-08 DIAGNOSIS — R05 Cough: Secondary | ICD-10-CM

## 2018-02-08 DIAGNOSIS — J45909 Unspecified asthma, uncomplicated: Secondary | ICD-10-CM | POA: Diagnosis not present

## 2018-02-08 DIAGNOSIS — Z131 Encounter for screening for diabetes mellitus: Secondary | ICD-10-CM

## 2018-02-08 LAB — GLUCOSE, POCT (MANUAL RESULT ENTRY): POC Glucose: 96 mg/dl (ref 70–99)

## 2018-02-08 MED ORDER — BENZONATATE 100 MG PO CAPS: 100.0000 mg | ORAL_CAPSULE | Freq: Three times a day (TID) | ORAL | 0 refills | Status: DC | PRN

## 2018-02-08 MED ORDER — PREDNISONE 20 MG PO TABS: 40.0000 mg | ORAL_TABLET | Freq: Every day | ORAL | 0 refills | Status: DC

## 2018-02-08 MED ORDER — HYDROCODONE-HOMATROPINE 5-1.5 MG/5ML PO SYRP: ORAL_SOLUTION | ORAL | 0 refills | Status: DC

## 2018-02-08 MED ORDER — ALBUTEROL SULFATE 108 (90 BASE) MCG/ACT IN AEPB: 1.0000 | INHALATION_SPRAY | RESPIRATORY_TRACT | 0 refills | Status: DC | PRN

## 2018-02-08 NOTE — Progress Notes (Signed)
Subjective:  By signing my name below, I, Stann Ore, attest that this documentation has been prepared under the direction and in the presence of Meredith Staggers, MD. Electronically Signed: Stann Ore, Scribe. 02/08/2018 , 5:51 PM .  Patient was seen in Room 8 .   Patient ID: Samantha Nicholson, female    DOB: 06/25/1985, 32 y.o.   MRN: [id number] Chief Complaint  Patient presents with  . Cough    2 weeks    HPI Samantha Nicholson is a 32 y.o. female  Patient states she's been having an irritant cough that's lasted for about 2 weeks now. She describes sensation of having some congestion stuck in her chest. She's had some productive cough of clear mucus. During the day, her coughs are usually more tame; but at night time, when she lays down, she has coughing fits. She informs having some sneezing initially. She mentions having some trouble catching her breath after coughing fit, as well as gagging without vomiting. She's also had some chest soreness with coughing fit with a slight wheeze. She's been taking OTC Xyzal. She denies any history of smoking. She denies any sick contact at home. She notes having a low grade fever of 99.9. She denies history of diabetes. She mentions having early signs of asthma with an inhaler in the past, but doesn't recall which one.   She informs moving a lot of dusty boxes at work about 2 weeks ago. She also took her kids to the fair a few weeks ago, so thought it was just allergies.    Patient Active Problem List   Diagnosis Date Noted  . Right otitis media 11/08/2016  . Otalgia of right ear 11/08/2016  . Influenza with respiratory manifestation other than pneumonia 07/13/2016  . Fever 07/13/2016  . Myalgia 07/13/2016  . Acute nonintractable headache 07/13/2016   Past Medical History:  Diagnosis Date  . Asthma    Past Surgical History:  Procedure Laterality Date  . MOUTH SURGERY    . TONSILLECTOMY     No Known Allergies Prior to Admission medications    Medication Sig Start Date End Date Taking? Authorizing Provider  benzonatate (TESSALON) 100 MG capsule Take 1-2 capsules (100-200 mg total) by mouth 3 (three) times daily as needed for cough. 04/20/17   Magdalene River, PA-C  etonogestrel (NEXPLANON) 68 MG IMPL implant Inject 1 each into the skin once.    [provider]  fluticasone (FLONASE) 50 MCG/ACT nasal spray SPRAY 2 SPRAYS INTO EACH NOSTRIL EVERY DAY 01/11/18   Doristine Bosworth, MD  HYDROcodone-homatropine (HYCODAN) 5-1.5 MG/5ML syrup Take 5 mLs by mouth every 8 (eight) hours as needed for cough. 04/20/17   Benjiman Core D, PA-C  pseudoephedrine (SUDAFED) 60 MG tablet Take 1 tablet (60 mg total) by mouth 2 (two) times daily. 04/20/17   Magdalene River, PA-C   Social History   Socioeconomic History  . Marital status: Single    Spouse name: Not on file  . Number of children: Not on file  . Years of education: Not on file  . Highest education level: Not on file  Occupational History  . Not on file  Social Needs  . Financial resource strain: Not on file  . Food insecurity:    Worry: Not on file    Inability: Not on file  . Transportation needs:    Medical: Not on file    Non-medical: Not on file  Tobacco Use  . Smoking status: Never  Smoker  . Smokeless tobacco: Never Used  Substance and Sexual Activity  . Alcohol use: No  . Drug use: No  . Sexual activity: Yes    Birth control/protection: Implant  Lifestyle  . Physical activity:    Days per week: Not on file    Minutes per session: Not on file  . Stress: Not on file  Relationships  . Social connections:    Talks on phone: Not on file    Gets together: Not on file    Attends religious service: Not on file    Active member of club or organization: Not on file    Attends meetings of clubs or organizations: Not on file    Relationship status: Not on file  . Intimate partner violence:    Fear of current or ex partner: Not on file    Emotionally  abused: Not on file    Physically abused: Not on file    Forced sexual activity: Not on file  Other Topics Concern  . Not on file  Social History Narrative  . Not on file   Review of Systems  Constitutional: Positive for fever (tmax 99.9). Negative for chills, fatigue and unexpected weight change.  HENT: Positive for congestion. Negative for sneezing.   Respiratory: Positive for cough and wheezing.   Gastrointestinal: Negative for constipation, diarrhea, nausea and vomiting.  Skin: Negative for rash and wound.  Neurological: Negative for dizziness, weakness and headaches.       Objective:   Physical Exam  Constitutional: She is oriented to person, place, and time. She appears well-developed and well-nourished. No distress.  HENT:  Head: Normocephalic and atraumatic.  Eyes: Pupils are equal, round, and reactive to light. EOM are normal.  Neck: Neck supple.  Cardiovascular: Normal rate.  Pulmonary/Chest: Effort normal and breath sounds normal. No respiratory distress.  Reproducible anterior chest wall pain  Musculoskeletal: Normal range of motion.  Neurological: She is alert and oriented to person, place, and time.  Skin: Skin is warm and dry.  Psychiatric: She has a normal mood and affect. Her behavior is normal.  Nursing note and vitals reviewed.   Vitals:   02/08/18 1710 02/08/18 1713  BP: (!) 139/94 132/80  Pulse: 83   Temp: 97.9 F (36.6 C)   TempSrc: Oral   SpO2: 97%   Weight: 202 lb (91.6 kg)   Height: 5\' 4"  (1.626 m)        Assessment & Plan:    Samantha Nicholson is a 32 y.o. female Cough - Plan: benzonatate (TESSALON) 100 MG capsule, predniSONE (DELTASONE) 20 MG tablet, HYDROcodone-homatropine (HYCODAN) 5-1.5 MG/5ML syrup  Screening for diabetes mellitus - Plan: POCT glucose (manual entry)  Pneumonitis - Plan: predniSONE (DELTASONE) 20 MG tablet  Reactive airway disease without complication, unspecified asthma severity, unspecified whether persistent -  Plan: predniSONE (DELTASONE) 20 MG tablet, Albuterol Sulfate (PROAIR RESPICLICK) 108 (90 Base) MCG/ACT AEPB  Persistent cough, suspected irritant/reactive airway with pneumonitis from dust.  Lungs were clear on exam, afebrile, reassuring exam.  Reassuring vital signs.   -Tessalon Perles 3 times daily as needed, hydrocodone cough syrup at bedtime if needed.  Side effects discussed and avoid use if any shortness of breath or wheezing.  -Pro Air if needed, start prednisone 40 mg daily x3 days, potential side effects/risks discussed  - rtc precautions.    Meds ordered this encounter  Medications  . benzonatate (TESSALON) 100 MG capsule    Sig: Take 1 capsule (100 mg total) by  mouth 3 (three) times daily as needed for cough.    Dispense:  20 capsule    Refill:  0  . predniSONE (DELTASONE) 20 MG tablet    Sig: Take 2 tablets (40 mg total) by mouth daily with breakfast.    Dispense:  6 tablet    Refill:  0  . Albuterol Sulfate (PROAIR RESPICLICK) 108 (90 Base) MCG/ACT AEPB    Sig: Inhale 1 Inhaler into the lungs every 4 (four) hours as needed.    Dispense:  1 each    Refill:  0  . HYDROcodone-homatropine (HYCODAN) 5-1.5 MG/5ML syrup    Sig: 58m by mouth a bedtime as needed for cough.    Dispense:  120 mL    Refill:  0   Patient Instructions    Cough is likely due to irritation or inflammation from allergies and dust.  That can also flare asthma or reactive airway symptoms.  Start prednisone 2 pills/day for the next 3 days.  Try Tessalon Perles 3 times per day as needed for cough, hydrocodone cough syrup at bedtime if needed but do not take that if your shortness of breath or wheezing.  For wheezing or shortness of breath or wheezing use the albuterol inhaler.  Recheck in the next week if not improving.   Return to the clinic or go to the nearest emergency room if any of your symptoms worsen or new symptoms occur.   Cough, Adult Coughing is a reflex that clears your throat and your  airways. Coughing helps to heal and protect your lungs. It is normal to cough occasionally, but a cough that happens with other symptoms or lasts a long time may be a sign of a condition that needs treatment. A cough may last only 2-3 weeks (acute), or it may last longer than 8 weeks (chronic). What are the causes? Coughing is commonly caused by:  Breathing in substances that irritate your lungs.  A viral or bacterial respiratory infection.  Allergies.  Asthma.  Postnasal drip.  Smoking.  Acid backing up from the stomach into the esophagus (gastroesophageal reflux).  Certain medicines.  Chronic lung problems, including COPD (or rarely, lung cancer).  Other medical conditions such as heart failure.  Follow these instructions at home: Pay attention to any changes in your symptoms. Take these actions to help with your discomfort:  Take medicines only as told by your health care provider. ? If you were prescribed an antibiotic medicine, take it as told by your health care provider. Do not stop taking the antibiotic even if you start to feel better. ? Talk with your health care provider before you take a cough suppressant medicine.  Drink enough fluid to keep your urine clear or pale yellow.  If the air is dry, use a cold steam vaporizer or humidifier in your bedroom or your home to help loosen secretions.  Avoid anything that causes you to cough at work or at home.  If your cough is worse at night, try sleeping in a semi-upright position.  Avoid cigarette smoke. If you smoke, quit smoking. If you need help quitting, ask your health care provider.  Avoid caffeine.  Avoid alcohol.  Rest as needed.  Contact a health care provider if:  You have new symptoms.  You cough up pus.  Your cough does not get better after 2-3 weeks, or your cough gets worse.  You cannot control your cough with suppressant medicines and you are losing sleep.  You develop pain that  is getting  worse or pain that is not controlled with pain medicines.  You have a fever.  You have unexplained weight loss.  You have night sweats. Get help right away if:  You cough up blood.  You have difficulty breathing.  Your heartbeat is very fast. This information is not intended to replace advice given to you by your health care provider. Make sure you discuss any questions you have with your health care provider. Document Released: 10/15/2010 Document Revised: 09/24/2015 Document Reviewed: 06/25/2014 Elsevier Interactive Patient Education  [employer].    If you have lab work done today you will be contacted with your lab results within the next 2 weeks.  If you have not heard from [place] then please contact us. The fastest way to get your results is to register for My Chart.   IF you received an x-ray today, you will receive an invoice from [facility]. Please contact [facility] at [phone] with questions or concerns regarding your invoice.   IF you received labwork today, you will receive an invoice from [place]. Please contact [facility] at [phone] with questions or concerns regarding your invoice.   Our billing staff will not be able to assist you with questions regarding bills from these companies.  You will be contacted with the lab results as soon as they are available. The fastest way to get your results is to activate your My Chart account. Instructions are located on the last page of this paperwork. If you have not heard from [place] regarding the results in 2 weeks, please contact this office.       I personally performed the services described in this documentation, which was scribed in my presence. The recorded information has been reviewed and considered for accuracy and completeness, addended by me as needed, and agree with information above.  Signed,   Meredith Staggers, MD Primary Care at [facility].  02/08/18 5:56  PM

## 2018-02-08 NOTE — Patient Instructions (Addendum)
Cough is likely due to irritation or inflammation from allergies and dust.  That can also flare asthma or reactive airway symptoms.  Start prednisone 2 pills/day for the next 3 days.  Try Tessalon Perles 3 times per day as needed for cough, hydrocodone cough syrup at bedtime if needed but do not take that if your shortness of breath or wheezing.  For wheezing or shortness of breath or wheezing use the albuterol inhaler.  Recheck in the next week if not improving.   Return to the clinic or go to the nearest emergency room if any of your symptoms worsen or new symptoms occur.   Cough, Adult Coughing is a reflex that clears your throat and your airways. Coughing helps to heal and protect your lungs. It is normal to cough occasionally, but a cough that happens with other symptoms or lasts a long time may be a sign of a condition that needs treatment. A cough may last only 2-3 weeks (acute), or it may last longer than 8 weeks (chronic). What are the causes? Coughing is commonly caused by:  Breathing in substances that irritate your lungs.  A viral or bacterial respiratory infection.  Allergies.  Asthma.  Postnasal drip.  Smoking.  Acid backing up from the stomach into the esophagus (gastroesophageal reflux).  Certain medicines.  Chronic lung problems, including COPD (or rarely, lung cancer).  Other medical conditions such as heart failure.  Follow these instructions at home: Pay attention to any changes in your symptoms. Take these actions to help with your discomfort:  Take medicines only as told by your health care provider. ? If you were prescribed an antibiotic medicine, take it as told by your health care provider. Do not stop taking the antibiotic even if you start to feel better. ? Talk with your health care provider before you take a cough suppressant medicine.  Drink enough fluid to keep your urine clear or pale yellow.  If the air is dry, use a cold steam vaporizer or  humidifier in your bedroom or your home to help loosen secretions.  Avoid anything that causes you to cough at work or at home.  If your cough is worse at night, try sleeping in a semi-upright position.  Avoid cigarette smoke. If you smoke, quit smoking. If you need help quitting, ask your health care provider.  Avoid caffeine.  Avoid alcohol.  Rest as needed.  Contact a health care provider if:  You have new symptoms.  You cough up pus.  Your cough does not get better after 2-3 weeks, or your cough gets worse.  You cannot control your cough with suppressant medicines and you are losing sleep.  You develop pain that is getting worse or pain that is not controlled with pain medicines.  You have a fever.  You have unexplained weight loss.  You have night sweats. Get help right away if:  You cough up blood.  You have difficulty breathing.  Your heartbeat is very fast. This information is not intended to replace advice given to you by your health care provider. Make sure you discuss any questions you have with your health care provider. Document Released: 10/15/2010 Document Revised: 09/24/2015 Document Reviewed: 06/25/2014 Elsevier Interactive Patient Education  [employer].    If you have lab work done today you will be contacted with your lab results within the next 2 weeks.  If you have not heard from [place] then please contact us. The fastest way to get your results  is to register for My Chart.   IF you received an x-ray today, you will receive an invoice from [facility]. Please contact [facility] at [phone] with questions or concerns regarding your invoice.   IF you received labwork today, you will receive an invoice from [place]. Please contact [facility] at [phone] with questions or concerns regarding your invoice.   Our billing staff will not be able to assist you with questions regarding bills from these companies.  You will  be contacted with the lab results as soon as they are available. The fastest way to get your results is to activate your My Chart account. Instructions are located on the last page of this paperwork. If you have not heard from [place] regarding the results in 2 weeks, please contact this office.

## 2018-06-06 DIAGNOSIS — R509 Fever, unspecified: Secondary | ICD-10-CM | POA: Diagnosis not present

## 2018-06-06 DIAGNOSIS — J0110 Acute frontal sinusitis, unspecified: Secondary | ICD-10-CM | POA: Diagnosis not present

## 2018-08-16 ENCOUNTER — Encounter: Payer: Self-pay | Admitting: Family Medicine

## 2018-08-20 ENCOUNTER — Encounter: Payer: Self-pay | Admitting: Family Medicine

## 2018-08-21 ENCOUNTER — Other Ambulatory Visit: Payer: Self-pay

## 2018-08-21 ENCOUNTER — Telehealth: Payer: BLUE CROSS/BLUE SHIELD | Admitting: Family Medicine

## 2018-08-21 ENCOUNTER — Ambulatory Visit (INDEPENDENT_AMBULATORY_CARE_PROVIDER_SITE_OTHER): Payer: BLUE CROSS/BLUE SHIELD | Admitting: Family Medicine

## 2018-08-21 DIAGNOSIS — T25222A Burn of second degree of left foot, initial encounter: Secondary | ICD-10-CM

## 2018-08-21 DIAGNOSIS — M25572 Pain in left ankle and joints of left foot: Secondary | ICD-10-CM | POA: Diagnosis not present

## 2018-08-21 DIAGNOSIS — Z23 Encounter for immunization: Secondary | ICD-10-CM

## 2018-08-21 MED ORDER — SILVER SULFADIAZINE 1 % EX CREA: 1.0000 "application " | TOPICAL_CREAM | Freq: Every day | CUTANEOUS | 0 refills | Status: DC

## 2018-08-21 MED ORDER — CEPHALEXIN 500 MG PO CAPS: 500.0000 mg | ORAL_CAPSULE | Freq: Two times a day (BID) | ORAL | 0 refills | Status: DC

## 2018-08-21 NOTE — Patient Instructions (Signed)
° ° ° °  If you have lab work done today you will be contacted with your lab results within the next 2 weeks.  If you have not heard from us then please contact us. The fastest way to get your results is to register for My Chart. ° ° °IF you received an x-ray today, you will receive an invoice from [facility]. Please contact [facility] at [phone] with questions or concerns regarding your invoice.  ° °IF you received labwork today, you will receive an invoice from [facility]. Please contact [facility] at [phone] with questions or concerns regarding your invoice.  ° °Our billing staff will not be able to assist you with questions regarding bills from these companies. ° °You will be contacted with the lab results as soon as they are available. The fastest way to get your results is to activate your My Chart account. Instructions are located on the last page of this paperwork. If you have not heard from us regarding the results in 2 weeks, please contact this office. °  ° ° ° °
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
## 2018-08-21 NOTE — Patient Instructions (Addendum)
Based on pictures today, I do not see an obvious cellulitis or skin infection, but that is possible.  At this point would recommend cleaning burn once per day with soap and water followed by application of Silvadene cream for the next 1 week.  If any spreading redness around the wound, can start the printed antibiotic.  Tetanus updated today.  Schedule follow-up visit in 1 week with pictures prior to that visit like he did today as that worked great.  If any worsening symptoms during that time, please let me know.  Okay to continue ibuprofen as needed for pain.  Good luck.  Dr. Neva SeatGreene    Burn Care, Adult A burn is an injury to the skin or the tissues under the skin. There are three types of burns:  First degree. These burns may cause the skin to be red and slightly swollen.  Second degree. These burns are very painful and cause the skin to be very red. The skin may also leak fluid, look shiny, and develop blisters.  Third degree. These burns cause permanent damage. They either turn the skin white or black and make it look charred, dry, and leathery. Taking care of your burn properly can help to prevent pain and infection. It can also help the burn to heal more quickly. What are the risks? Complications from burns include:  Damage to the skin.  Reduced blood flow near the injury.  Dead tissue.  Scarring.  Problems with movement, if the burn happened near a joint or on the hands or feet. Severe burns can lead to problems that affect the whole body, such as:  Fluid loss.  Less blood circulating in the body.  Inability to maintain a normal core body temperature (thermoregulation).  Infection.  Shock.  Problems breathing. How to care for a first-degree burn Right after a burn:  Rinse or soak the burn under cool water until the pain stops. Do not put ice on your burn. This can cause more damage.  Lightly cover the burn with a sterile cloth (dressing). Burn care  Follow  instructions from your health care provider about: ? How to clean and take care of the burn. ? When to change and remove the dressing.  Check your burn every day for signs of infection. Check for: ? More redness, swelling, or pain. ? Warmth. ? Pus or a bad smell. Medicine  Take over-the-counter and prescription medicines only as told by your health care provider.  If you were prescribed antibiotic medicine, take or apply it as told by your health care provider. Do not stop using the antibiotic even if your condition improves. General instructions  To prevent infection, do not put butter, oil, or other home remedies on your burn.  Do not rub your burn, even when you are cleaning it.  Protect your burn from the sun. How to care for a second-degree burn Right after a burn:  Rinse or soak the burn under cool water. Do this for several minutes. Do not put ice on your burn. This can cause more damage.  Lightly cover the burn with a sterile cloth (dressing). Burn care  Raise (elevate) the injured area above the level of your heart while sitting or lying down.  Follow instructions from your health care provider about: ? How to clean and take care of the burn. ? When to change and remove the dressing.  Check your burn every day for signs of infection. Check for: ? More redness, swelling, or pain. ?  Warmth. ? Pus or a bad smell. Medicine   Take over-the-counter and prescription medicines only as told by your health care provider.  If you were prescribed antibiotic medicine, take or apply it as told by your health care provider. Do not stop using the antibiotic even if your condition improves. General instructions  To prevent infection: ? Do not put butter, oil, or other home remedies on the burn. ? Do not scratch or pick at the burn. ? Do not break any blisters. ? Do not peel skin.  Do not rub your burn, even when you are cleaning it.  Protect your burn from the sun. How  to care for a third-degree burn Right after a burn:  Lightly cover the burn with gauze.  Seek immediate medical attention. Burn care  Raise (elevate) the injured area above the level of your heart while sitting or lying down.  Drink enough fluid to keep your urine clear or pale yellow.  Rest as told by your health care provider. Do not participate in sports or other physical activities until your health care provider approves.  Follow instructions from your health care provider about: ? How to clean and take care of the burn. ? When to change and remove the dressing.  Check your burn every day for signs of infection. Check for: ? More redness, swelling, or pain. ? Warmth. ? Pus or a bad smell. Medicine  Take over-the-counter and prescription medicines only as told by your health care provider.  If you were prescribed antibiotic medicine, take or apply it as told by your health care provider. Do not stop using the antibiotic even if your condition improves. General instructions  To prevent infection: ? Do not put butter, oil, or other home remedies on the burn. ? Do not scratch or pick at the burn. ? Do not break any blisters. ? Do not peel skin. ? Do not rub your burn, even when you are cleaning it.  Protect your burn from the sun.  Keep all follow-up visits as told by your health care provider. This is important. Contact a health care provider if:  Your condition does not improve.  Your condition gets worse.  You have a fever.  Your burn changes in appearance or develops black or red spots.  Your burn feels warm to the touch.  Your pain is not controlled with medicine. Get help right away if:  You have redness, swelling, or pain at the site of the burn.  You have fluid, blood, or pus coming from your burn.  You have red streaks near the burn.  You have severe pain. This information is not intended to replace advice given to you by your health care  provider. Make sure you discuss any questions you have with your health care provider. Document Released: 04/18/2005 Document Revised: 11/08/2015 Document Reviewed: 10/06/2015 Elsevier Interactive Patient Education  [employer].   If you have lab work done today you will be contacted with your lab results within the next 2 weeks.  If you have not heard from [place] then please contact us. The fastest way to get your results is to register for My Chart.   IF you received an x-ray today, you will receive an invoice from [facility]. Please contact [facility] at [phone] with questions or concerns regarding your invoice.   IF you received labwork today, you will receive an invoice from [facility]. Please contact [facility] at [phone] with questions or concerns regarding your invoice.  Our billing staff will not be able to assist you with questions regarding bills from these companies.  You will be contacted with the lab results as soon as they are available. The fastest way to get your results is to activate your My Chart account. Instructions are located on the last page of this paperwork. If you have not heard from [place] regarding the results in 2 weeks, please contact this office.

## 2018-08-21 NOTE — Progress Notes (Signed)
Virtual Visit via Telephone Note  I connected with Samantha Nicholson on 08/21/18 at 9:43 AM by telephone and verified that I am speaking with the correct person using two identifiers.   I discussed the limitations, risks, security and privacy concerns of performing an evaluation and management service by telephone and the availability of in person appointments. I also discussed with the patient that there may be a patient responsible charge related to this service. The patient expressed understanding and agreed to proceed, consent obtained  Chief complaint:  burn on left foot.   History of Present Illness:  L foot burn: Happened almost 2 weeks ago. Hair straightener accidentally fell onto barefoot.  Blistering initially - popped about week later.  Still sore, red on area. Hurts to wear shoe and walk distance.  Out of work d/t pandemic. Some days more red than others, depending on activity. Not worsening redness.   Tx: peroxide cleaning daily, bactine spray, neosporin daily. Ibuprofen as needed - helping pain.   Due for tetanus.  Patient Active Problem List   Diagnosis Date Noted  . Right otitis media 11/08/2016  . Otalgia of right ear 11/08/2016  . Influenza with respiratory manifestation other than pneumonia 07/13/2016  . Fever 07/13/2016  . Myalgia 07/13/2016  . Acute nonintractable headache 07/13/2016   Past Medical History:  Diagnosis Date  . Asthma    Past Surgical History:  Procedure Laterality Date  . MOUTH SURGERY    . TONSILLECTOMY     No Known Allergies Prior to Admission medications   Medication Sig Start Date End Date Taking? Authorizing Provider  Albuterol Sulfate (PROAIR RESPICLICK) 108 (90 Base) MCG/ACT AEPB Inhale 1 Inhaler into the lungs every 4 (four) hours as needed. 02/08/18  Yes Shade FloodGreene, Graclynn Vanantwerp R, MD  etonogestrel (NEXPLANON) 68 MG IMPL implant Inject 1 each into the skin once.   Yes [provider]  levocetirizine (XYZAL) 5 MG tablet Take 5  mg by mouth every evening.   Yes [provider]   Social History   Socioeconomic History  . Marital status: Single    Spouse name: Not on file  . Number of children: Not on file  . Years of education: Not on file  . Highest education level: Not on file  Occupational History  . Not on file  Social Needs  . Financial resource strain: Not on file  . Food insecurity:    Worry: Not on file    Inability: Not on file  . Transportation needs:    Medical: Not on file    Non-medical: Not on file  Tobacco Use  . Smoking status: Never Smoker  . Smokeless tobacco: Never Used  Substance and Sexual Activity  . Alcohol use: No  . Drug use: No  . Sexual activity: Yes    Birth control/protection: Implant  Lifestyle  . Physical activity:    Days per week: Not on file    Minutes per session: Not on file  . Stress: Not on file  Relationships  . Social connections:    Talks on phone: Not on file    Gets together: Not on file    Attends religious service: Not on file    Active member of club or organization: Not on file    Attends meetings of clubs or organizations: Not on file    Relationship status: Not on file  . Intimate partner violence:    Fear of current or ex partner: Not on file  Emotionally abused: Not on file    Physically abused: Not on file    Forced sexual activity: Not on file  Other Topics Concern  . Not on file  Social History Narrative  . Not on file     Observations/Objective: No distress on phone.   Photos form yesterday.  Media Information   Document Information   Patient Upload:  Patient Entered Attachment  image  08/20/2018  Attached To:  Patient Message on 08/20/18 with Shade Flood, MD  Source Information   Mychart, Generic    Media Information   Document Information   Patient Upload:  Patient Entered Attachment  [id number]...  08/20/2018  Attached To:  Patient Message on 08/20/18 with Shade Flood, MD  Source Information   Mychart, Generic    Assessment and Plan: Partial thickness burn of left foot, initial encounter - Plan: Tdap vaccine greater than or equal to 7yo IM, silver sulfADIAZINE (SILVADENE) 1 % cream, cephALEXin (KEFLEX) 500 MG capsule  Pain of joint of left ankle and foot - Plan: Tdap vaccine greater than or equal to 7yo IM  Need for diphtheria-tetanus-pertussis (Tdap) vaccine Partial-thickness burn of foot, initial photo suspicious for cellulitis, but on second image do not see any surrounding erythema.  -Plans for Tdap today.  -Stop peroxide cleansing, change to soap and water, and start Silvadene once per day.  Ibuprofen okay if needed for pain.  -If any spreading erythema, printed prescription for Keflex to start twice per day.  -Recheck 1 week with status, sooner if worse.  Handout given.  Follow Up Instructions:  1 week.    Patient Instructions   Based on pictures today, I do not see an obvious cellulitis or skin infection, but that is possible.  At this point would recommend cleaning burn once per day with soap and water followed by application of Silvadene cream for the next 1 week.  If any spreading redness around the wound, can start the printed antibiotic.  Tetanus updated today.  Schedule follow-up visit in 1 week with pictures prior to that visit like he did today as that worked great.  If any worsening symptoms during that time, please let me know.  Okay to continue ibuprofen as needed for pain.  Good luck.  Dr. Neva Seat    Burn Care, Adult A burn is an injury to the skin or the tissues under the skin. There are three types of burns:  First degree. These burns may cause the skin to be red and slightly swollen.  Second degree. These burns are very painful and cause the skin to be very red. The skin may also leak fluid, look shiny, and develop blisters.  Third degree. These burns cause permanent damage. They either turn the skin white or black and  make it look charred, dry, and leathery. Taking care of your burn properly can help to prevent pain and infection. It can also help the burn to heal more quickly. What are the risks? Complications from burns include:  Damage to the skin.  Reduced blood flow near the injury.  Dead tissue.  Scarring.  Problems with movement, if the burn happened near a joint or on the hands or feet. Severe burns can lead to problems that affect the whole body, such as:  Fluid loss.  Less blood circulating in the body.  Inability to maintain a normal core body temperature (thermoregulation).  Infection.  Shock.  Problems breathing. How to care for a first-degree burn Right after a  burn:  Rinse or soak the burn under cool water until the pain stops. Do not put ice on your burn. This can cause more damage.  Lightly cover the burn with a sterile cloth (dressing). Burn care  Follow instructions from your health care provider about: ? How to clean and take care of the burn. ? When to change and remove the dressing.  Check your burn every day for signs of infection. Check for: ? More redness, swelling, or pain. ? Warmth. ? Pus or a bad smell. Medicine  Take over-the-counter and prescription medicines only as told by your health care provider.  If you were prescribed antibiotic medicine, take or apply it as told by your health care provider. Do not stop using the antibiotic even if your condition improves. General instructions  To prevent infection, do not put butter, oil, or other home remedies on your burn.  Do not rub your burn, even when you are cleaning it.  Protect your burn from the sun. How to care for a second-degree burn Right after a burn:  Rinse or soak the burn under cool water. Do this for several minutes. Do not put ice on your burn. This can cause more damage.  Lightly cover the burn with a sterile cloth (dressing). Burn care  Raise (elevate) the injured area above  the level of your heart while sitting or lying down.  Follow instructions from your health care provider about: ? How to clean and take care of the burn. ? When to change and remove the dressing.  Check your burn every day for signs of infection. Check for: ? More redness, swelling, or pain. ? Warmth. ? Pus or a bad smell. Medicine   Take over-the-counter and prescription medicines only as told by your health care provider.  If you were prescribed antibiotic medicine, take or apply it as told by your health care provider. Do not stop using the antibiotic even if your condition improves. General instructions  To prevent infection: ? Do not put butter, oil, or other home remedies on the burn. ? Do not scratch or pick at the burn. ? Do not break any blisters. ? Do not peel skin.  Do not rub your burn, even when you are cleaning it.  Protect your burn from the sun. How to care for a third-degree burn Right after a burn:  Lightly cover the burn with gauze.  Seek immediate medical attention. Burn care  Raise (elevate) the injured area above the level of your heart while sitting or lying down.  Drink enough fluid to keep your urine clear or pale yellow.  Rest as told by your health care provider. Do not participate in sports or other physical activities until your health care provider approves.  Follow instructions from your health care provider about: ? How to clean and take care of the burn. ? When to change and remove the dressing.  Check your burn every day for signs of infection. Check for: ? More redness, swelling, or pain. ? Warmth. ? Pus or a bad smell. Medicine  Take over-the-counter and prescription medicines only as told by your health care provider.  If you were prescribed antibiotic medicine, take or apply it as told by your health care provider. Do not stop using the antibiotic even if your condition improves. General instructions  To prevent infection:  ? Do not put butter, oil, or other home remedies on the burn. ? Do not scratch or pick at the burn. ?  Do not break any blisters. ? Do not peel skin. ? Do not rub your burn, even when you are cleaning it.  Protect your burn from the sun.  Keep all follow-up visits as told by your health care provider. This is important. Contact a health care provider if:  Your condition does not improve.  Your condition gets worse.  You have a fever.  Your burn changes in appearance or develops black or red spots.  Your burn feels warm to the touch.  Your pain is not controlled with medicine. Get help right away if:  You have redness, swelling, or pain at the site of the burn.  You have fluid, blood, or pus coming from your burn.  You have red streaks near the burn.  You have severe pain. This information is not intended to replace advice given to you by your health care provider. Make sure you discuss any questions you have with your health care provider. Document Released: 04/18/2005 Document Revised: 11/08/2015 Document Reviewed: 10/06/2015 Elsevier Interactive Patient Education  [employer].   If you have lab work done today you will be contacted with your lab results within the next 2 weeks.  If you have not heard from [place] then please contact us. The fastest way to get your results is to register for My Chart.   IF you received an x-ray today, you will receive an invoice from [facility]. Please contact [facility] at [phone] with questions or concerns regarding your invoice.   IF you received labwork today, you will receive an invoice from [facility]. Please contact [facility] at [phone] with questions or concerns regarding your invoice.   Our billing staff will not be able to assist you with questions regarding bills from these companies.  You will be contacted with the lab results as soon as they are available. The fastest way to get your results is  to activate your My Chart account. Instructions are located on the last page of this paperwork. If you have not heard from [place] regarding the results in 2 weeks, please contact this office.          I discussed the assessment and treatment plan with the patient. The patient was provided an opportunity to ask questions and all were answered. The patient agreed with the plan and demonstrated an understanding of the instructions.   The patient was advised to call back or seek an in-person evaluation if the symptoms worsen or if the condition fails to improve as anticipated.  I provided 12 minutes of non-face-to-face time during this encounter.  Signed,   Meredith Staggers, MD Primary Care at [facility].  08/21/18

## 2018-08-27 ENCOUNTER — Encounter: Payer: Self-pay | Admitting: Family Medicine

## 2018-08-28 ENCOUNTER — Telehealth (INDEPENDENT_AMBULATORY_CARE_PROVIDER_SITE_OTHER): Payer: BLUE CROSS/BLUE SHIELD | Admitting: Family Medicine

## 2018-08-28 ENCOUNTER — Other Ambulatory Visit: Payer: Self-pay

## 2018-08-28 DIAGNOSIS — T25222D Burn of second degree of left foot, subsequent encounter: Secondary | ICD-10-CM

## 2018-08-28 NOTE — Patient Instructions (Addendum)
  I am glad to hear the burn is doing better.  The picture does look like it is healing without signs of infection.  Okay to continue the Silvadene once per day for another 4 to 5 days, then can change to Neosporin or Vaseline if needed.  If it looks like it is healing over the top can stop use of antibiotic cream, but continue to cleanse area with soap and water at least once per day.  If any increased redness, pain or worsening please schedule another visit.  Take care.    If you have lab work done today you will be contacted with your lab results within the next 2 weeks.  If you have not heard from [place] then please contact us. The fastest way to get your results is to register for My Chart.   IF you received an x-ray today, you will receive an invoice from [facility]. Please contact [facility] at [phone] with questions or concerns regarding your invoice.   IF you received labwork today, you will receive an invoice from [place]. Please contact [facility] at [phone] with questions or concerns regarding your invoice.   Our billing staff will not be able to assist you with questions regarding bills from these companies.  You will be contacted with the lab results as soon as they are available. The fastest way to get your results is to activate your My Chart account. Instructions are located on the last page of this paperwork. If you have not heard from [place] regarding the results in 2 weeks, please contact this office.

## 2018-08-28 NOTE — Progress Notes (Signed)
Virtual Visit via Telephone Note  I connected with Samantha Nicholson on 08/28/18 at 9:02 AM by telephone and verified that I am speaking with the correct person using two identifiers.   I discussed the limitations, risks, security and privacy concerns of performing an evaluation and management service by telephone and the availability of in person appointments. I also discussed with the patient that there may be a patient responsible charge related to this service. The patient expressed understanding and agreed to proceed, consent obtained  Chief complaint:  L foot burn  History of Present Illness: Samantha Nicholson is a 33 y.o. female  Follow up L foot burn.  Feeling much better. Some itching - has not used additional  Silvadene twice per day.  Walking fine now, less pain.  Scab has come off.    Patient Active Problem List   Diagnosis Date Noted   Right otitis media 11/08/2016   Otalgia of right ear 11/08/2016   Influenza with respiratory manifestation other than pneumonia 07/13/2016   Fever 07/13/2016   Myalgia 07/13/2016   Acute nonintractable headache 07/13/2016   Past Medical History:  Diagnosis Date   Asthma    Past Surgical History:  Procedure Laterality Date   MOUTH SURGERY     TONSILLECTOMY     No Known Allergies Prior to Admission medications   Medication Sig Start Date End Date Taking? Authorizing Provider  Albuterol Sulfate (PROAIR RESPICLICK) 108 (90 Base) MCG/ACT AEPB Inhale 1 Inhaler into the lungs every 4 (four) hours as needed. 02/08/18  Yes Shade Flood, MD  etonogestrel (NEXPLANON) 68 MG IMPL implant Inject 1 each into the skin once.   Yes [provider]  levocetirizine (XYZAL) 5 MG tablet Take 5 mg by mouth every evening.   Yes [provider]  silver sulfADIAZINE (SILVADENE) 1 % cream Apply 1 application topically daily. 08/21/18  Yes Shade Flood, MD   Social History   Socioeconomic History   Marital status:  Single    Spouse name: Not on file   Number of children: Not on file   Years of education: Not on file   Highest education level: Not on file  Occupational History   Not on file  Social Needs   Financial resource strain: Not on file   Food insecurity:    Worry: Not on file    Inability: Not on file   Transportation needs:    Medical: Not on file    Non-medical: Not on file  Tobacco Use   Smoking status: Never Smoker   Smokeless tobacco: Never Used  Substance and Sexual Activity   Alcohol use: No   Drug use: No   Sexual activity: Yes    Birth control/protection: Implant  Lifestyle   Physical activity:    Days per week: Not on file    Minutes per session: Not on file   Stress: Not on file  Relationships   Social connections:    Talks on phone: Not on file    Gets together: Not on file    Attends religious service: Not on file    Active member of club or organization: Not on file    Attends meetings of clubs or organizations: Not on file    Relationship status: Not on file   Intimate partner violence:    Fear of current or ex partner: Not on file    Emotionally abused: Not on file    Physically abused: Not on file  Forced sexual activity: Not on file  Other Topics Concern   Not on file  Social History Narrative   Not on file     Observations/Objective:   Photo from patient yesterday  Assessment and Plan: Partial thickness burn of left foot, subsequent encounter  -Healing, no sign of surrounding cellulitis.  Agree with avoiding Keflex at this time.    -continue Silvadene daily after cleansing once or twice per day.  Can transition from Silvadene to Neosporin or Vaseline in the next 4 to 5 days, risk of pigmentation discussed.  RTC precautions given  Follow Up Instructions: Prn  Patient Instructions    I am glad to hear the burn is doing better.  The picture does look like it is healing without signs of infection.  Okay to continue the  Silvadene once per day for another 4 to 5 days, then can change to Neosporin or Vaseline if needed.  If it looks like it is healing over the top can stop use of antibiotic cream, but continue to cleanse area with soap and water at least once per day.  If any increased redness, pain or worsening please schedule another visit.  Take care.    If you have lab work done today you will be contacted with your lab results within the next 2 weeks.  If you have not heard from us then please contact us. The fastest way to get your results is to register for My Chart.   IF you received an x-ray today, you will receive an invoice from [facility]. Please contact [facility] at [phone] with questions or concerns regarding your invoice.   IF you received labwork today, you will receive an invoice from [facility]. Please contact [facility] at [phone] with questions or concerns regarding your invoice.   Our billing staff will not be able to assist you with questions regarding bills from these companies.  You will be contacted with the lab results as soon as they are available. The fastest way to get your results is to activate your My Chart account. Instructions are located on the last page of this paperwork. If you have not heard from us regarding the results in 2 weeks, please contact this office.          I discussed the assessment and treatment plan with the patient. The patient was provided an opportunity to ask questions and all were answered. The patient agreed with the plan and demonstrated an understanding of the instructions.   The patient was advised to call back or seek an in-person evaluation if the symptoms worsen or if the condition fails to improve as anticipated.  I provided 3 minutes of non-face-to-face time during this encounter.  Signed,   Meredith StaggersJeffrey Vivek Grealish, MD [facility].  08/28/18

## 2018-08-28 NOTE — Progress Notes (Signed)
CC- F/u on burn on left foot. Patient stated she has sent some up to date picture to my chart for you to take a look at. Patient stated she is doing much better. Area is still a little red. Patient have not started the keflex yet until after speak with you today.

## 2018-10-10 ENCOUNTER — Telehealth: Payer: BLUE CROSS/BLUE SHIELD | Admitting: Family Medicine

## 2018-10-23 ENCOUNTER — Ambulatory Visit (HOSPITAL_COMMUNITY)
Admission: EM | Admit: 2018-10-23 | Discharge: 2018-10-23 | Disposition: A | Discharge status: Home / Self Care | Payer: 59 | Attending: Family Medicine | Admitting: Family Medicine

## 2018-10-23 ENCOUNTER — Other Ambulatory Visit: Payer: Self-pay

## 2018-10-23 ENCOUNTER — Encounter (HOSPITAL_COMMUNITY): Payer: Self-pay

## 2018-10-23 DIAGNOSIS — S39012A Strain of muscle, fascia and tendon of lower back, initial encounter: Secondary | ICD-10-CM

## 2018-10-23 MED ORDER — CYCLOBENZAPRINE HCL 10 MG PO TABS: 5.0000 mg | ORAL_TABLET | Freq: Every day | ORAL | 0 refills | Status: DC

## 2018-10-23 MED ORDER — PREDNISONE 10 MG PO TABS: 20.0000 mg | ORAL_TABLET | Freq: Every day | ORAL | 0 refills | Status: AC

## 2018-10-23 NOTE — ED Triage Notes (Signed)
Pt states she has been having back spasms x 1 week or more.

## 2018-10-23 NOTE — Discharge Instructions (Signed)
Take the medication as prescribed. Be aware the muscle relaxant will make you drowsy If your symptoms continue or worsen you will need to come back for recheck and possible imaging.

## 2018-10-24 NOTE — ED Provider Notes (Signed)
[facility]    CSN: [id number] Arrival date & time: 10/23/18  1410     History   Chief Complaint Chief Complaint  Patient presents with  . Back Pain    appt 2:30    HPI Samantha Nicholson is a 33 y.o. female.   Patient is a 33 year old female with past medical history of asthma.  She presents with back spasms x1 week.  This is been constant, waxing and waning. She has not been taking anything for her symptoms.  She has intermittent radiation of pain into both upper legs.  Denies any specific numbness or tingling.  Denies any fevers, chills, loss of bowel or bladder function.  Denies any heavy lifting or injuries to the back.  Reporting she has been sitting a lot more than normal.  ROS per HPI      Past Medical History:  Diagnosis Date  . Asthma     Patient Active Problem List   Diagnosis Date Noted  . Right otitis media 11/08/2016  . Otalgia of right ear 11/08/2016  . Influenza with respiratory manifestation other than pneumonia 07/13/2016  . Fever 07/13/2016  . Myalgia 07/13/2016  . Acute nonintractable headache 07/13/2016    Past Surgical History:  Procedure Laterality Date  . MOUTH SURGERY    . TONSILLECTOMY      OB History   No obstetric history on file.      Home Medications    Prior to Admission medications   Medication Sig Start Date End Date Taking? Authorizing Provider  Albuterol Sulfate (PROAIR RESPICLICK) 108 (90 Base) MCG/ACT AEPB Inhale 1 Inhaler into the lungs every 4 (four) hours as needed. 02/08/18   Shade FloodGreene, Jeffrey R, MD  cyclobenzaprine (FLEXERIL) 10 MG tablet Take 0.5 tablets (5 mg total) by mouth at bedtime. 10/23/18   Dahlia ByesBast, Sora Olivo A, NP  etonogestrel (NEXPLANON) 68 MG IMPL implant Inject 1 each into the skin once.    [provider]  levocetirizine (XYZAL) 5 MG tablet Take 5 mg by mouth every evening.    [provider]  predniSONE (DELTASONE) 10 MG tablet Take 2 tablets (20 mg total) by mouth daily for 5 days.  10/23/18 10/28/18  Dahlia ByesBast, Jaycub Noorani A, NP  silver sulfADIAZINE (SILVADENE) 1 % cream Apply 1 application topically daily. 08/21/18   Shade FloodGreene, Jeffrey R, MD    Family History Family History  Problem Relation Age of Onset  . Heart disease Father   . Kidney disease Father   . Diabetes Maternal Grandmother     Social History Social History   Tobacco Use  . Smoking status: Never Smoker  . Smokeless tobacco: Never Used  Substance Use Topics  . Alcohol use: No  . Drug use: No     Allergies   Patient has no known allergies.   Review of Systems Review of Systems   Physical Exam Triage Vital Signs ED Triage Vitals  Enc Vitals Group     BP 10/23/18 1441 140/88     Pulse Rate 10/23/18 1441 76     Resp 10/23/18 1441 18     Temp 10/23/18 1441 98.8 F (37.1 C)     Temp src --      SpO2 10/23/18 1441 100 %     Weight 10/23/18 1441 200 lb (90.7 kg)     Height --      Head Circumference --      Peak Flow --      Pain Score 10/23/18 1440 9  Pain Loc --      Pain Edu? --      Excl. in [place]? --    No data found.  Updated Vital Signs BP 140/88 (BP Location: Right Arm)   Pulse 76   Temp 98.8 F (37.1 C)   Resp 18   Wt 200 lb (90.7 kg)   SpO2 100%   BMI 34.33 kg/m   Visual Acuity Right Eye Distance:   Left Eye Distance:   Bilateral Distance:    Right Eye Near:   Left Eye Near:    Bilateral Near:     Physical Exam Vitals signs and nursing note reviewed.  Constitutional:      Appearance: Normal appearance.  HENT:     Head: Normocephalic and atraumatic.     Nose: Nose normal.  Eyes:     Conjunctiva/sclera: Conjunctivae normal.  Neck:     Musculoskeletal: Normal range of motion.  Pulmonary:     Effort: Pulmonary effort is normal.  Musculoskeletal:     Lumbar back: She exhibits tenderness. She exhibits no bony tenderness, no swelling, no edema, no deformity, no laceration, no spasm and normal pulse.       Back:  Skin:    General: Skin is warm and dry.      Findings: No rash.  Neurological:     General: No focal deficit present.     Mental Status: She is alert.  Psychiatric:        Mood and Affect: Mood normal.      UC Treatments / Results  Labs (all labs ordered are listed, but only abnormal results are displayed) Labs Reviewed - No data to display  EKG None  Radiology No results found.  Procedures Procedures (including critical care time)  Medications Ordered in UC Medications - No data to display  Initial Impression / Assessment and Plan / UC Course  I have reviewed the triage vital signs and the nursing notes.  Pertinent labs & imaging results that were available during my care of the patient were reviewed by me and considered in my medical decision making (see chart for details).    Lumbar strain/spasm Treating with flexeril Ibuprofen as needed.  Follow up as needed for continued or worsening symptoms She may need imaging if the pain continues.  Final Clinical Impressions(s) / UC Diagnoses   Final diagnoses:  Strain of lumbar region, initial encounter     Discharge Instructions     Take the medication as prescribed. Be aware the muscle relaxant will make you drowsy If your symptoms continue or worsen you will need to come back for recheck and possible imaging.    ED Prescriptions    Medication Sig Dispense Auth. Provider   predniSONE (DELTASONE) 10 MG tablet Take 2 tablets (20 mg total) by mouth daily for 5 days. 10 tablet Yusuke Beza A, NP   cyclobenzaprine (FLEXERIL) 10 MG tablet Take 0.5 tablets (5 mg total) by mouth at bedtime. 20 tablet Loura Halt A, NP     Controlled Substance Prescriptions [place] Controlled Substance Registry consulted? Not Applicable   Orvan July, NP 10/24/18 [phone]

## 2018-10-30 ENCOUNTER — Ambulatory Visit: Payer: Self-pay | Admitting: Family Medicine

## 2018-11-06 ENCOUNTER — Other Ambulatory Visit: Payer: Self-pay | Admitting: Family Medicine

## 2018-11-06 ENCOUNTER — Ambulatory Visit (INDEPENDENT_AMBULATORY_CARE_PROVIDER_SITE_OTHER): Payer: 59 | Admitting: Family Medicine

## 2018-11-06 ENCOUNTER — Encounter: Payer: Self-pay | Admitting: Family Medicine

## 2018-11-06 ENCOUNTER — Ambulatory Visit
Admission: RE | Admit: 2018-11-06 | Discharge: 2018-11-06 | Disposition: A | Discharge status: Home / Self Care | Payer: 59 | Source: Ambulatory Visit | Attending: Family Medicine | Admitting: Family Medicine

## 2018-11-06 ENCOUNTER — Other Ambulatory Visit: Payer: Self-pay

## 2018-11-06 VITALS — BP 118/87 | HR 96 | Temp 99.3°F | Ht 64.0 in | Wt 202.2 lb

## 2018-11-06 DIAGNOSIS — M5440 Lumbago with sciatica, unspecified side: Secondary | ICD-10-CM

## 2018-11-06 NOTE — Patient Instructions (Signed)
° ° ° °  If you have lab work done today you will be contacted with your lab results within the next 2 weeks.  If you have not heard from us then please contact us. The fastest way to get your results is to register for My Chart. ° ° °IF you received an x-ray today, you will receive an invoice from [facility]. Please contact [facility] at [phone] with questions or concerns regarding your invoice.  ° °IF you received labwork today, you will receive an invoice from [facility]. Please contact [facility] at [phone] with questions or concerns regarding your invoice.  ° °Our billing staff will not be able to assist you with questions regarding bills from these companies. ° °You will be contacted with the lab results as soon as they are available. The fastest way to get your results is to activate your My Chart account. Instructions are located on the last page of this paperwork. If you have not heard from us regarding the results in 2 weeks, please contact this office. °  ° ° ° °

## 2018-11-06 NOTE — Addendum Note (Signed)
Addended by: Maryruth Hancock on: 11/06/2018 07:00 PM   Modules accepted: Orders
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
## 2018-11-07 ENCOUNTER — Telehealth: Payer: Self-pay | Admitting: Family Medicine

## 2018-11-07 ENCOUNTER — Encounter: Payer: Self-pay | Admitting: Family Medicine

## 2018-11-07 NOTE — Telephone Encounter (Signed)
Copied from [place] [id number]. Topic: General - Other >> Nov 07, 2018  9:37 AM Yvette Rack wrote: Reason for CRM: Pt requests copy of x-ray that was done yesterday. Pt requests call back.

## 2018-11-09 NOTE — Telephone Encounter (Signed)
Spoke to patient and reveiwed xray with her She has an S shaped spine  Advised that she follow up with her referrals when contacted Dr. Holly Bodily referred her to Neurosurgery and PT.  She is currently taking ibuprofen Continue to do so  Call clinic to ask for a copy of xray on disc.

## 2018-11-20 ENCOUNTER — Other Ambulatory Visit: Payer: Self-pay

## 2018-11-20 ENCOUNTER — Encounter: Payer: Self-pay | Admitting: Physical Therapy

## 2018-11-20 ENCOUNTER — Ambulatory Visit: Payer: 59 | Attending: Family Medicine | Admitting: Physical Therapy

## 2018-11-20 DIAGNOSIS — M4125 Other idiopathic scoliosis, thoracolumbar region: Secondary | ICD-10-CM | POA: Diagnosis present

## 2018-11-20 DIAGNOSIS — M5442 Lumbago with sciatica, left side: Secondary | ICD-10-CM | POA: Diagnosis not present

## 2018-11-20 DIAGNOSIS — G8929 Other chronic pain: Secondary | ICD-10-CM | POA: Insufficient documentation

## 2018-11-20 NOTE — Patient Instructions (Addendum)
Access Code: [email or web address]  URL: [email or web address]  Date: 11/20/2018  Prepared by: Elsie Ra   Exercises  Standing Diaphragmatic Breathing - 10 reps - 3 sets - 2x daily - 6x weekly  Sidelying Diaphragmatic Breathing - 10 reps - 3 sets - 2x daily - 6x weekly  Quadruped Alternating Arm Lift - 10 reps - 3 sets - 2x daily - 6x weekly  Quadruped Leg Lifts - 10 reps - 3 sets - 2x daily - 6x weekly  Supine Bridge with Spinal Articulation - 10 reps - 3 sets - 2x daily - 6x weekly  Supine Shoulder Horizontal Abduction with Resistance - 10 reps - 3 sets - 2x daily - 6x weekly  Rows 2-3 sets of 10   TENS UNIT: This is helpful for muscle pain and spasm.   Search and Purchase a TENS 7000 2nd edition at www.tenspros.com. It should be less than $30.     TENS unit instructions: Do not shower or bathe with the unit on Turn the unit off before removing electrodes or batteries If the electrodes lose stickiness add a drop of water to the electrodes after they are disconnected from the unit and place on plastic sheet. If you continued to have difficulty, call the TENS unit company to purchase more electrodes. Do not apply lotion on the skin area prior to use. Make sure the skin is clean and dry as this will help prolong the life of the electrodes. After use, always check skin for unusual red areas, rash or other skin difficulties. If there are any skin problems, does not apply electrodes to the same area. Never remove the electrodes from the unit by pulling the wires. Do not use the TENS unit or electrodes other than as directed. Do not change electrode placement without consultating your therapist or physician. Keep 2 fingers with between each electrode. Wear time ratio is 2:1, on to off times.    For example on for 30 minutes off for 15 minutes and then on for 30 minutes off for 15 minutes
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
## 2018-11-27 ENCOUNTER — Encounter: Payer: Self-pay | Admitting: Physical Therapy

## 2018-11-27 ENCOUNTER — Ambulatory Visit: Payer: 59 | Admitting: Physical Therapy

## 2018-11-27 ENCOUNTER — Other Ambulatory Visit: Payer: Self-pay

## 2018-11-27 DIAGNOSIS — G8929 Other chronic pain: Secondary | ICD-10-CM

## 2018-11-27 DIAGNOSIS — M4125 Other idiopathic scoliosis, thoracolumbar region: Secondary | ICD-10-CM

## 2018-11-27 DIAGNOSIS — M5442 Lumbago with sciatica, left side: Secondary | ICD-10-CM | POA: Diagnosis not present

## 2018-11-27 NOTE — Therapy (Signed)
Samantha Nicholson, [place], [address] Phone: [phone]   Fax:  [phone]  Physical Therapy Treatment  Patient Details  Name: Samantha Nicholson MRN: [id number] Date of Birth: Mar 27, 1986 Referring Provider (PT): Maryruth Hancock, MD   Encounter Date: 11/27/2018  PT End of Session - 11/27/18 1150    Visit Number  2    Number of Visits  16    Date for PT Re-Evaluation  01/15/19    Authorization Type  UHC    PT Start Time  1150    PT Stop Time  1231    PT Time Calculation (min)  41 min    Activity Tolerance  Patient tolerated treatment well    Behavior During Therapy  [facility] for tasks assessed/performed       Past Medical History:  Diagnosis Date  . Asthma     Past Surgical History:  Procedure Laterality Date  . MOUTH SURGERY    . TONSILLECTOMY      There were no vitals filed for this visit.  Subjective Assessment - 11/27/18 1150    Subjective  Pt reports she is doing her HEP has some difficulty with the ones on her knees, bought TENs and is using this and heat at home    Currently in Pain?  Yes    Pain Score  3     Pain Location  Back    Pain Orientation  Left;Upper    Pain Descriptors / Indicators  Aching;Dull    Pain Type  Chronic pain    Pain Onset  More than a month ago    Pain Frequency  Constant    Aggravating Factors   as before    Pain Relieving Factors  muscle relaxors - tryin to wean off.                       [facility] Adult PT Treatment/Exercise - 11/27/18 0001      Exercises   Exercises  Lumbar      Lumbar Exercises: Stretches   ITB Stretch  Left;Right;2 reps;30 seconds   cross body with strap     Lumbar Exercises: Aerobic   UBE (Upper Arm Bike)  x4' alt FWD/BWD      Lumbar Exercises: Supine   Ab Set  10 reps;5 seconds   squeezing physioball in hooklying   Pelvic Tilt  2 reps;10 reps   single leg press with SKTC pull inbetween   Bridge  10 reps;5  seconds;Non-compliant   legs on pysioball     Lumbar Exercises: Sidelying   Clam  Both;20 reps   reverse clams both feet coming up    Other Sidelying Lumbar Exercises  15 reps book openers with green band each side.       Lumbar Exercises: Prone   Other Prone Lumbar Exercises  2x10 lower body lifts with knees wide and feet together      Lumbar Exercises: Quadruped   Madcat/Old Horse  10 reps    Madcat/Old Horse Limitations  difficulty with movement pattern    Other Quadruped Lumbar Exercises  childs pose                PT Short Term Goals - 11/27/18 1206      PT SHORT TERM GOAL #1   Title  Pt will be I and compliant with HEP. 4 weeks 12/20/18    Baseline  doing well so far with inital HEP  Status  On-going      PT SHORT TERM GOAL #2   Title  Pt will reduce pain by 25%    Status  On-going        PT Long Term Goals - 11/27/18 1206      PT LONG TERM GOAL #1   Title  Pt will improve lumbar ROM to [facility]. (Target for all goals 8 weeks 01/15/19)    Status  On-going      PT LONG TERM GOAL #2   Title  Pt will improve FOTO to less than 43% limited to show improved function    Status  On-going      PT LONG TERM GOAL #3   Title  She will report improved pain by at least 50% with ususal activity and sleeping    Baseline  pt has had some improvement in sleeping - using a pillow to assist    Status  On-going      PT LONG TERM GOAL #4   Title  She will improve posture in order to improve functional abilities and decrease pain.    Status  On-going            Plan - 11/27/18 1228    Clinical Impression Statement  This is [name] first visit since her eval.  She tolerated all exercise well with minimal back pain.  the pain she did have settled down with stretching and rest. Pt reports she is sleeping a little better progressing to her goals.    PT Frequency  2x / week    PT Duration  8 weeks    PT Treatment/Interventions  Cryotherapy;Electrical  Stimulation;Iontophoresis 4mg /ml Dexamethasone;Aquatic Therapy;Moist Heat;Traction;Ultrasound;Gait training;Therapeutic activities;Therapeutic exercise;Neuromuscular re-education;Manual techniques;Orthotic Fit/Training;Passive range of motion;Dry needling;Joint Manipulations;Spinal Manipulations;Taping    PT Next Visit Plan  needs posture, core strength, and stetching, pilates, progress functional lifting when able    Consulted and Agree with Plan of Care  Patient       Patient will benefit from skilled therapeutic intervention in order to improve the following deficits and impairments:  Abnormal gait, Decreased balance, Decreased mobility, Decreased range of motion, Decreased strength, Difficulty walking, Increased muscle spasms, Increased fascial restricitons, Impaired flexibility, Postural dysfunction, Improper body mechanics, Pain, Obesity  Visit Diagnosis: 1. Chronic bilateral low back pain with left-sided sciatica   2. Other idiopathic scoliosis, thoracolumbar region        Problem List Patient Active Problem List   Diagnosis Date Noted  . Right otitis media 11/08/2016  . Otalgia of right ear 11/08/2016  . Influenza with respiratory manifestation other than pneumonia 07/13/2016  . Fever 07/13/2016  . Myalgia 07/13/2016  . Acute nonintractable headache 07/13/2016    Samantha ScarceSusan Nicholson PT  11/27/2018, 12:32 PM  [facility] [place], [place], [address] Phone: [phone]   Fax:  [phone]  Name: Samantha Nicholson MRN: [id number] Date of Birth: 06/17/1985

## 2018-11-29 ENCOUNTER — Other Ambulatory Visit: Payer: Self-pay

## 2018-11-29 ENCOUNTER — Encounter: Payer: Self-pay | Admitting: Physical Therapy

## 2018-11-29 ENCOUNTER — Ambulatory Visit: Payer: 59 | Admitting: Physical Therapy

## 2018-11-29 DIAGNOSIS — M5442 Lumbago with sciatica, left side: Secondary | ICD-10-CM | POA: Diagnosis not present

## 2018-11-29 DIAGNOSIS — M4125 Other idiopathic scoliosis, thoracolumbar region: Secondary | ICD-10-CM

## 2018-11-29 DIAGNOSIS — G8929 Other chronic pain: Secondary | ICD-10-CM

## 2018-11-29 NOTE — Therapy (Signed)
[place], [place], [address] Phone: [phone]   Fax:  [phone]  Physical Therapy Treatment  Patient Details  Name: Samantha Nicholson MRN: [id number] Date of Birth: 04/10/1986 Referring Provider (PT): Samantha Hancock, MD   Encounter Date: 11/29/2018  PT End of Session - 11/29/18 1146    Visit Number  3    Number of Visits  16    Date for PT Re-Evaluation  01/15/19    Authorization Type  UHC    PT Start Time  1147    PT Stop Time  1226    PT Time Calculation (min)  39 min    Activity Tolerance  Patient tolerated treatment well    Behavior During Therapy  [facility] for tasks assessed/performed       Past Medical History:  Diagnosis Date  . Asthma     Past Surgical History:  Procedure Laterality Date  . MOUTH SURGERY    . TONSILLECTOMY      There were no vitals filed for this visit.  Subjective Assessment - 11/29/18 1147    Subjective  Pt reports she is feeling pretty good today.  Wasn't hurting yesterday until she picked up a heavy bag of dog food, did settle down quickly.  Today slightly sore.    Patient Stated Goals  get pain more manageble and straighten my back out    Currently in Pain?  Yes    Pain Score  1     Pain Location  Back    Pain Orientation  Lower    Pain Descriptors / Indicators  Dull         [facility] PT Assessment - 11/29/18 0001      Assessment   Medical Diagnosis  LBP with Left leg radiculopathy, scoliosis(Rt upper Thoraic concave 42 deg, Lt lower thoracic concave 42 deg, Rt lumbar concave 25 deg)    Referring Provider (PT)  Samantha Nicholson, Samantha Kras, MD      AROM   AROM Assessment Site  Lumbar    Lumbar Flexion  7" from the floor    Lumbar Extension  WNL, pain at thoracolumbar junction     Lumbar - Right Rotation  WNL    Lumbar - Left Rotation  WNL                   [facility] Adult PT Treatment/Exercise - 11/29/18 0001      Exercises   Exercises  Lumbar      Lumbar  Exercises: Stretches   Double Knee to Chest Stretch  60 seconds      Lumbar Exercises: Aerobic   UBE (Upper Arm Bike)  L2x4' alt FWD/BWD      Lumbar Exercises: Standing   Row  Strengthening;Both;15 reps    Row Limitations  5#, FWD hinge, row, return to stand. VC to engage core.     Other Standing Lumbar Exercises  2x10 modified dead lift with 10#    Other Standing Lumbar Exercises  10 reps each side BWD lunge into hip flexion      Lumbar Exercises: Supine   Dead Bug  20 reps   2x10 alternating sides.    Dead Bug Limitations  VC for form, modified to keep feet down d/t increased pain in low back    Bridge  20 reps   wiht lat pull down holding 5#, attempted 10# to heavy   Other Supine Lumbar Exercises  10 reps LTR with TA engagement  lifting feet off mat      Lumbar Exercises: Prone   Other Prone Lumbar Exercises  lower body lifts with 5 heel taps x 10 reps      Lumbar Exercises: Quadruped   Opposite Arm/Leg Raise  Left arm/Right leg;Right arm/Left leg;10 reps;1 second   VC for form              PT Short Term Goals - 11/27/18 1206      PT SHORT TERM GOAL #1   Title  Pt will be I and compliant with HEP. 4 weeks 12/20/18    Baseline  doing well so far with inital HEP    Status  On-going      PT SHORT TERM GOAL #2   Title  Pt will reduce pain by 25%    Status  On-going        PT Long Term Goals - 11/27/18 1206      PT LONG TERM GOAL #1   Title  Pt will improve lumbar ROM to [facility]. (Target for all goals 8 weeks 01/15/19)    Status  On-going      PT LONG TERM GOAL #2   Title  Pt will improve FOTO to less than 43% limited to show improved function    Status  On-going      PT LONG TERM GOAL #3   Title  She will report improved pain by at least 50% with ususal activity and sleeping    Baseline  pt has had some improvement in sleeping - using a pillow to assist    Status  On-going      PT LONG TERM GOAL #4   Title  She will improve posture in order to improve  functional abilities and decrease pain.    Status  On-going            Plan - 11/29/18 1231    Clinical Impression Statement  Samantha Nicholson is reporting overall decrease in back symptoms. She is still very weak in her core and hips as observed with functional movements. She is ready to progress to pilates  Making progress to her goals.    Rehab Potential  Fair    PT Frequency  2x / week    PT Duration  8 weeks    PT Treatment/Interventions  Cryotherapy;Electrical Stimulation;Iontophoresis 4mg /ml Dexamethasone;Aquatic Therapy;Moist Heat;Traction;Ultrasound;Gait training;Therapeutic activities;Therapeutic exercise;Neuromuscular re-education;Manual techniques;Orthotic Fit/Training;Passive range of motion;Dry needling;Joint Manipulations;Spinal Manipulations;Taping    PT Next Visit Plan  pilates core work       Patient will benefit from skilled therapeutic intervention in order to improve the following deficits and impairments:  Abnormal gait, Decreased balance, Decreased mobility, Decreased range of motion, Decreased strength, Difficulty walking, Increased muscle spasms, Increased fascial restricitons, Impaired flexibility, Postural dysfunction, Improper body mechanics, Pain, Obesity  Visit Diagnosis: 1. Chronic bilateral low back pain with left-sided sciatica   2. Other idiopathic scoliosis, thoracolumbar region        Problem List Patient Active Problem List   Diagnosis Date Noted  . Right otitis media 11/08/2016  . Otalgia of right ear 11/08/2016  . Influenza with respiratory manifestation other than pneumonia 07/13/2016  . Fever 07/13/2016  . Myalgia 07/13/2016  . Acute nonintractable headache 07/13/2016    Roderic ScarceSusan Loris Nicholson PT  11/29/2018, 12:34 PM  [facility] [address] [place], [place], [address] Phone: [phone]   Fax:  [phone]  Name: Samantha Nicholson MRN: [id number] Date of Birth: [birth year]

## 2018-12-04 ENCOUNTER — Encounter: Payer: Self-pay | Admitting: Physical Therapy

## 2018-12-04 ENCOUNTER — Ambulatory Visit: Payer: 59 | Attending: Family Medicine | Admitting: Physical Therapy

## 2018-12-04 ENCOUNTER — Other Ambulatory Visit: Payer: Self-pay

## 2018-12-04 DIAGNOSIS — G8929 Other chronic pain: Secondary | ICD-10-CM | POA: Insufficient documentation

## 2018-12-04 DIAGNOSIS — M4125 Other idiopathic scoliosis, thoracolumbar region: Secondary | ICD-10-CM | POA: Diagnosis present

## 2018-12-04 DIAGNOSIS — M5442 Lumbago with sciatica, left side: Secondary | ICD-10-CM | POA: Insufficient documentation

## 2018-12-04 NOTE — Therapy (Signed)
[facility] [address] [place], [place], [address] Phone: [phone]   Fax:  [phone]  Physical Therapy Treatment  Patient Details  Name: Samantha Massonshley M Vanscyoc MRN: [id number] Date of Birth: 02-May-1986 Referring Provider (PT): Wandra Feinsteinorum, Lisa L, MD   Encounter Date: 12/04/2018  PT End of Session - 12/04/18 1150    Visit Number  4    Number of Visits  16    Date for PT Re-Evaluation  01/15/19    PT Start Time  1135    PT Stop Time  1215    PT Time Calculation (min)  40 min    Activity Tolerance  Patient tolerated treatment well    Behavior During Therapy  [facility] for tasks assessed/performed       Past Medical History:  Diagnosis Date  . Asthma     Past Surgical History:  Procedure Laterality Date  . MOUTH SURGERY    . TONSILLECTOMY      There were no vitals filed for this visit.  Subjective Assessment - 12/04/18 1140    Subjective  Pain 2/10 in low back.  No leg pain Friday and Sat.  Had alot of pain after last session but did have leg pain over the weekend.  sSes heat and TENS.    Currently in Pain?  Yes    Pain Score  2     Pain Location  Back    Pain Orientation  Lower    Pain Descriptors / Indicators  Discomfort    Pain Type  Chronic pain    Pain Onset  More than a month ago    Pain Frequency  Constant        [facility] Adult PT Treatment/Exercise - 12/04/18 0001      Self-Care   Self-Care  Other Self-Care Comments    Other Self-Care Comments   Pilates resources       Lumbar Exercises: Sidelying   Other Sidelying Lumbar Exercises  over Pilates Arc, Rt side up x 1 min       Lumbar Exercises: Quadruped   Madcat/Old Horse  10 reps    Madcat/Old Horse Limitations  limited with flexion     Opposite Arm/Leg Raise  Right arm/Left leg;Left arm/Right leg;5 reps;5 seconds   VC for form   Opposite Arm/Leg Raise Limitations  min cues         Pilates Tower for LE/Core strength, postural strength, lumbopelvic  disassociation and core control.  Exercises included:  Supine Leg Springs single leg Yellow spring x 10 each   Arm Springs single arm then double arm , added SLR x 5 each for core strengthening in neutral spine.    Seated push through bar for spinal mobility added rotation in extension for lengthening      PT Education - 12/04/18 1149    Education Details  Pilates vs Yoga, pilates resources    Person(s) Educated  Patient    Methods  Explanation    Comprehension  Verbalized understanding       PT Short Term Goals - 11/27/18 1206      PT SHORT TERM GOAL #1   Title  Pt will be I and compliant with HEP. 4 weeks 12/20/18    Baseline  doing well so far with inital HEP    Status  On-going      PT SHORT TERM GOAL #2   Title  Pt will reduce pain by 25%    Status  On-going  PT Long Term Goals - 11/27/18 1206      PT LONG TERM GOAL #1   Title  Pt will improve lumbar ROM to [facility]. (Target for all goals 8 weeks 01/15/19)    Status  On-going      PT LONG TERM GOAL #2   Title  Pt will improve FOTO to less than 43% limited to show improved function    Status  On-going      PT LONG TERM GOAL #3   Title  She will report improved pain by at least 50% with ususal activity and sleeping    Baseline  pt has had some improvement in sleeping - using a pillow to assist    Status  On-going      PT LONG TERM GOAL #4   Title  She will improve posture in order to improve functional abilities and decrease pain.    Status  On-going            Plan - 12/04/18 1151    Clinical Impression Statement  Kyanna knows her HEP and is working on performing daily.  She lacks core control and strength.   Tolerated PIlates exercises well using Tower.    PT Treatment/Interventions  Cryotherapy;Electrical Stimulation;Iontophoresis 4mg /ml Dexamethasone;Aquatic Therapy;Moist Heat;Traction;Ultrasound;Gait training;Therapeutic activities;Therapeutic exercise;Neuromuscular re-education;Manual  techniques;Orthotic Fit/Training;Passive range of motion;Dry needling;Joint Manipulations;Spinal Manipulations;Taping    PT Next Visit Plan  pilates core work    PT Home Exercise Plan  breathing, H abd , bird dog , bridge    Consulted and Agree with Plan of Care  Patient       Patient will benefit from skilled therapeutic intervention in order to improve the following deficits and impairments:  Abnormal gait, Decreased balance, Decreased mobility, Decreased range of motion, Decreased strength, Difficulty walking, Increased muscle spasms, Increased fascial restricitons, Impaired flexibility, Postural dysfunction, Improper body mechanics, Pain, Obesity  Visit Diagnosis: 1. Chronic bilateral low back pain with left-sided sciatica   2. Other idiopathic scoliosis, thoracolumbar region        Problem List Patient Active Problem List   Diagnosis Date Noted  . Right otitis media 11/08/2016  . Otalgia of right ear 11/08/2016  . Influenza with respiratory manifestation other than pneumonia 07/13/2016  . Fever 07/13/2016  . Myalgia 07/13/2016  . Acute nonintractable headache 07/13/2016    , 12/04/2018, 1:47 PM  [facility] [address] [place], [place], [address] Phone: [phone]   Fax:  [phone]  Name: ROSELIN WIEMANN MRN: [id number] Date of Birth: 11/06/1985  Raeford Razor, PT 12/04/18 1:50 PM Phone: [phone] Fax: [phone]

## 2018-12-04 NOTE — Patient Instructions (Signed)
Access Code: [email or web address]  URL: [email or web address]  Date: 11/20/2018  Prepared by: Raeford Razor   Exercises  Standing Diaphragmatic Breathing - 10 reps - 3 sets - 2x daily - 6x weekly  Sidelying Diaphragmatic Breathing - 10 reps - 3 sets - 2x daily - 6x weekly  Quadruped Alternating Arm Lift - 10 reps - 3 sets - 2x daily - 6x weekly  Quadruped Leg Lifts - 10 reps - 3 sets - 2x daily - 6x weekly  Supine Bridge with Spinal Articulation - 10 reps - 3 sets - 2x daily - 6x weekly  Supine Shoulder Horizontal Abduction with Resistance - 10 reps - 3 sets - 2x daily - 6x weekly  Standing Row with Resistance - 10 reps - 2-3 sets - 2x daily - 6x weekly

## 2018-12-06 ENCOUNTER — Ambulatory Visit: Payer: 59 | Admitting: Physical Therapy

## 2018-12-06 ENCOUNTER — Encounter: Payer: Self-pay | Admitting: Physical Therapy

## 2018-12-06 ENCOUNTER — Other Ambulatory Visit: Payer: Self-pay

## 2018-12-06 DIAGNOSIS — M4125 Other idiopathic scoliosis, thoracolumbar region: Secondary | ICD-10-CM

## 2018-12-06 DIAGNOSIS — M5442 Lumbago with sciatica, left side: Secondary | ICD-10-CM | POA: Diagnosis not present

## 2018-12-06 DIAGNOSIS — G8929 Other chronic pain: Secondary | ICD-10-CM

## 2018-12-06 NOTE — Therapy (Signed)
[place], [place], [address] Phone: [phone]   Fax:  [phone]  Physical Therapy Treatment  Patient Details  Name: Samantha Nicholson MRN: [id number] Date of Birth: 1985/12/05 Referring Provider (PT): Maryruth Hancock, MD   Encounter Date: 12/06/2018  PT End of Session - 12/06/18 1050    Visit Number  5    Number of Visits  16    Date for PT Re-Evaluation  01/15/19    Authorization Type  UHC    PT Start Time  1048    PT Stop Time  1131    PT Time Calculation (min)  43 min    Activity Tolerance  Patient tolerated treatment well    Behavior During Therapy  [facility] for tasks assessed/performed       Past Medical History:  Diagnosis Date  . Asthma     Past Surgical History:  Procedure Laterality Date  . MOUTH SURGERY    . TONSILLECTOMY      There were no vitals filed for this visit.  Subjective Assessment - 12/06/18 1050    Subjective  Was not sore after last session, no pain today either.    Currently in Pain?  No/denies           [facility] Adult PT Treatment/Exercise - 12/06/18 0001      Pilates   Pilates Reformer  Footwork 2 Red 1 blue parallel on heels and toes, and with turnout    Other Pilates  Rerformer: single leg press out and bridge with 3 springs cues for foot position , patient with genu valgus and pes planus    Supine arms 1 R 1 y too heavy reduced to 1 R 1 x 5 reps Arcs     Lumbar Exercises: Aerobic   Stationary Bike  5 min L2 for warm up       Lumbar Exercises: Supine   Clam Limitations  with band, unilateral green x 10 each     Bridge  10 reps    Bridge Limitations  with band       Manual Therapy   Manual therapy comments  manual A given for arm position and keeping Legs in table top position during arm work              PT Education - 12/06/18 1228    Education Details  alignment throughout session    Person(s) Educated  Patient    Methods  Explanation    Comprehension  Verbalized understanding       PT Short Term Goals - 11/27/18 1206      PT SHORT TERM GOAL #1   Title  Pt will be I and compliant with HEP. 4 weeks 12/20/18    Baseline  doing well so far with inital HEP    Status  On-going      PT SHORT TERM GOAL #2   Title  Pt will reduce pain by 25%    Status  On-going        PT Long Term Goals - 11/27/18 1206      PT LONG TERM GOAL #1   Title  Pt will improve lumbar ROM to [facility]. (Target for all goals 8 weeks 01/15/19)    Status  On-going      PT LONG TERM GOAL #2   Title  Pt will improve FOTO to less than 43% limited to show improved function    Status  On-going  PT LONG TERM GOAL #3   Title  She will report improved pain by at least 50% with ususal activity and sleeping    Baseline  pt has had some improvement in sleeping - using a pillow to assist    Status  On-going      PT LONG TERM GOAL #4   Title  She will improve posture in order to improve functional abilities and decrease pain.    Status  On-going            Plan - 12/06/18 1229    Clinical Impression Statement  Patient without pain for a few days now, able to progress to Reformer exercises with mod difficulty with level 1 exercises namely keeping legs in table top (lower abd weakness) .  Unable to get full hip extension in bridging. Will continue to benefit from strengthening to reduce pain and improve function.    PT Treatment/Interventions  Cryotherapy;Electrical Stimulation;Iontophoresis 4mg /ml Dexamethasone;Aquatic Therapy;Moist Heat;Traction;Ultrasound;Gait training;Therapeutic activities;Therapeutic exercise;Neuromuscular re-education;Manual techniques;Orthotic Fit/Training;Passive range of motion;Dry needling;Joint Manipulations;Spinal Manipulations;Taping    PT Next Visit Plan  pilates core work    PT Home Exercise Plan  breathing, H abd , bird dog , bridge    Consulted and Agree with Plan of Care  Patient       Patient will benefit from  skilled therapeutic intervention in order to improve the following deficits and impairments:  Abnormal gait, Decreased balance, Decreased mobility, Decreased range of motion, Decreased strength, Difficulty walking, Increased muscle spasms, Increased fascial restricitons, Impaired flexibility, Postural dysfunction, Improper body mechanics, Pain, Obesity  Visit Diagnosis: 1. Chronic bilateral low back pain with left-sided sciatica   2. Other idiopathic scoliosis, thoracolumbar region        Problem List Patient Active Problem List   Diagnosis Date Noted  . Right otitis media 11/08/2016  . Otalgia of right ear 11/08/2016  . Influenza with respiratory manifestation other than pneumonia 07/13/2016  . Fever 07/13/2016  . Myalgia 07/13/2016  . Acute nonintractable headache 07/13/2016    Samantha Nicholson 12/06/2018, 12:33 PM  [facility] [address] [place], [place], [address] Phone: [phone]   Fax:  [phone]  Name: Samantha Nicholson MRN: [id number] Date of Birth: [birth year]  Karie MainlandJennifer Kaelei Wheeler, PT 12/06/18 12:33 PM Phone: [phone] Fax: [phone]

## 2018-12-11 ENCOUNTER — Ambulatory Visit: Payer: 59 | Admitting: Physical Therapy

## 2018-12-11 ENCOUNTER — Other Ambulatory Visit: Payer: Self-pay

## 2018-12-11 DIAGNOSIS — G8929 Other chronic pain: Secondary | ICD-10-CM

## 2018-12-11 DIAGNOSIS — M5442 Lumbago with sciatica, left side: Secondary | ICD-10-CM | POA: Diagnosis not present

## 2018-12-11 DIAGNOSIS — M4125 Other idiopathic scoliosis, thoracolumbar region: Secondary | ICD-10-CM

## 2018-12-11 NOTE — Patient Instructions (Signed)
Step 1  Step 2  Butterfly Bridge reps: 10  sets: 2  daily: 1  weekly: 7 Setup  Begin lying on your back with both legs bent and your feet resting on the floor. Movement  Tighten your abdominals and lift your hips off the floor into a bridge position. As you hold this position and pull your knees apart, then bring them back and repeat. Tip  Make sure to keep your back straight during the exercise and do not let your hips drop or rotate to either side.

## 2018-12-11 NOTE — Therapy (Signed)
[place], [place], [address] Phone: [phone]   Fax:  [phone]  Physical Therapy Treatment  Patient Details  Name: Samantha Nicholson MRN: [id number] Date of Birth: [birth year] Referring Provider (PT): Maryruth Hancock, MD   Encounter Date: 12/11/2018  PT End of Session - 12/11/18 1145    Visit Number  6    Number of Visits  16    Date for PT Re-Evaluation  01/15/19    Authorization Type  UHC    PT Start Time  1133    PT Stop Time  1215    PT Time Calculation (min)  42 min    Activity Tolerance  Patient tolerated treatment well    Behavior During Therapy  [facility] for tasks assessed/performed       Past Medical History:  Diagnosis Date  . Asthma     Past Surgical History:  Procedure Laterality Date  . MOUTH SURGERY    . TONSILLECTOMY      There were no vitals filed for this visit.  Subjective Assessment - 12/11/18 1140    Subjective  Had spasm in low back Sat and Sunday, lasted 1-2 hours off and on, can be spontaneous.  Is improving, less frequent and less intensity.  No pain right now.    Currently in Pain?  No/denies         [facility] Adult PT Treatment/Exercise - 12/11/18 0001      Lumbar Exercises: Aerobic   Nustep  6 min L5 UE and LE       Lumbar Exercises: Supine   Clam  20 reps    Clam Limitations  ball under pelvis     Heel Slides  10 reps    Heel Slides Limitations  ball     Bent Knee Raise  10 reps    Bent Knee Raise Limitations  ball     Bridge  10 reps    Bridge Limitations  with band     Bridge with clamshell  5 reps    Bridge with [employer] Limitations  x 3        Pilates Reformer used for LE/core strength, postural strength, lumbopelvic disassociation and core control.  Exercises included: Mermaid  1 Red x 5 emphasize breathing, added rotation and extension to each side, more limiting to Rt         PT Short Term Goals - 12/11/18 1300      PT SHORT TERM GOAL #1    Title  Pt will be I and compliant with HEP. 4 weeks 12/20/18    Status  Achieved      PT SHORT TERM GOAL #2   Title  Pt will reduce pain by 25%    Status  Achieved        PT Long Term Goals - 12/11/18 1300      PT LONG TERM GOAL #1   Title  Pt will improve lumbar ROM to [facility]. (Target for all goals 8 weeks 01/15/19)    Status  Unable to assess      PT LONG TERM GOAL #2   Title  Pt will improve FOTO to less than 43% limited to show improved function    Status  Unable to assess      PT LONG TERM GOAL #3   Title  She will report improved pain by at least 50% with ususal activity and sleeping    Baseline  pt has had some  improvement in sleeping - using a pillow to assist    Status  Partially Met      PT LONG TERM GOAL #4   Title  She will improve posture in order to improve functional abilities and decrease pain.    Status  On-going            Plan - 12/11/18 1300    Clinical Impression Statement  Patient with good HEP compliance and no longer having leg pain , back pain, spasms lessening over time.  Will continue to benefit from PT for postural re-education and functional strengthening.    PT Treatment/Interventions  Cryotherapy;Electrical Stimulation;Iontophoresis 36m/ml Dexamethasone;Aquatic Therapy;Moist Heat;Traction;Ultrasound;Gait training;Therapeutic activities;Therapeutic exercise;Neuromuscular re-education;Manual techniques;Orthotic Fit/Training;Passive range of motion;Dry needling;Joint Manipulations;Spinal Manipulations;Taping    PT Next Visit Plan  pilates core work    PT Home Exercise Plan  breathing, H abd , bird dog , bridge, butterfly bridge       Patient will benefit from skilled therapeutic intervention in order to improve the following deficits and impairments:  Abnormal gait, Decreased balance, Decreased mobility, Decreased range of motion, Decreased strength, Difficulty walking, Increased muscle spasms, Increased fascial restricitons, Impaired flexibility,  Postural dysfunction, Improper body mechanics, Pain, Obesity  Visit Diagnosis: 1. Chronic bilateral low back pain with left-sided sciatica   2. Other idiopathic scoliosis, thoracolumbar region        Problem List Patient Active Problem List   Diagnosis Date Noted  . Right otitis media 11/08/2016  . Otalgia of right ear 11/08/2016  . Influenza with respiratory manifestation other than pneumonia 07/13/2016  . Fever 07/13/2016  . Myalgia 07/13/2016  . Acute nonintractable headache 07/13/2016    Samantha Nicholson 12/11/2018, 1:02 PM  [facility] [address]Phone: [phone]  Fax:  [phone] Name: Samantha FEDERERMRN: [id number] of Birth: 7Jan 11, 1987  JRaeford Razor PT 12/11/18 1:02 PM Phone: [phone]: [phone]

## 2018-12-13 ENCOUNTER — Encounter: Payer: Self-pay | Admitting: Physical Therapy

## 2018-12-13 ENCOUNTER — Ambulatory Visit: Payer: 59 | Admitting: Physical Therapy

## 2018-12-13 ENCOUNTER — Other Ambulatory Visit: Payer: Self-pay

## 2018-12-13 DIAGNOSIS — M5442 Lumbago with sciatica, left side: Secondary | ICD-10-CM | POA: Diagnosis not present

## 2018-12-13 DIAGNOSIS — G8929 Other chronic pain: Secondary | ICD-10-CM

## 2018-12-13 DIAGNOSIS — M4125 Other idiopathic scoliosis, thoracolumbar region: Secondary | ICD-10-CM

## 2018-12-13 NOTE — Therapy (Signed)
[place], [place], [address] Phone: [phone]   Fax:  [phone]  Physical Therapy Treatment  Patient Details  Name: Samantha Nicholson MRN: [id number] Date of Birth: [birth year] Referring Provider (PT): Maryruth Hancock, MD   Encounter Date: 12/13/2018  PT End of Session - 12/13/18 1136    Visit Number  7    Number of Visits  16    Date for PT Re-Evaluation  01/15/19    PT Start Time  1132    PT Stop Time  1211    PT Time Calculation (min)  39 min    Activity Tolerance  Patient tolerated treatment well    Behavior During Therapy  [facility] for tasks assessed/performed       Past Medical History:  Diagnosis Date  . Asthma     Past Surgical History:  Procedure Laterality Date  . MOUTH SURGERY    . TONSILLECTOMY      There were no vitals filed for this visit.  Subjective Assessment - 12/13/18 1135    Subjective  Pain is very brief, mild and sometimes goes hours without pain. Very pleased.       [place] Adult PT Treatment/Exercise - 12/13/18 0001      Pilates   Pilates Reformer  see notes        Pilates Reformer used for LE/core strength, postural strength, lumbopelvic disassociation and core control.  Exercises included:  Footwork 2 Red 1 blue with band for outer hip activation , parallel turnout   Bridging all springs with band , improved hip extension here with all springs for stability   Supine Arms 1 Red Arcs and circles, A needed for table top x 10 each   Feet in Straps 1 Red 1 Yellow   Arcs and squats with hips ER  Long box prone overhead press 1 Red double arm and then single arm press x 10 each , manual cues for scapular position Swan prep x 10 improved with reps and cues for neck and shoulders           PT Education - 12/13/18 1145    Education Details  POC , pilates as a long term intervention    Person(s) Educated  Patient    Methods  Explanation    Comprehension   Verbalized understanding;Returned demonstration       PT Short Term Goals - 12/11/18 1300      PT SHORT TERM GOAL #1   Title  Pt will be I and compliant with HEP. 4 weeks 12/20/18    Status  Achieved      PT SHORT TERM GOAL #2   Title  Pt will reduce pain by 25%    Status  Achieved        PT Long Term Goals - 12/11/18 1300      PT LONG TERM GOAL #1   Title  Pt will improve lumbar ROM to [facility]. (Target for all goals 8 weeks 01/15/19)    Status  Unable to assess      PT LONG TERM GOAL #2   Title  Pt will improve FOTO to less than 43% limited to show improved function    Status  Unable to assess      PT LONG TERM GOAL #3   Title  She will report improved pain by at least 50% with ususal activity and sleeping    Baseline  pt has had some improvement in sleeping -  using a pillow to assist    Status  Partially Met      PT LONG TERM GOAL #4   Title  She will improve posture in order to improve functional abilities and decrease pain.    Status  On-going            Plan - 12/13/18 1220    Clinical Impression Statement  Patient felt good as she left clinic.  Needs occasional assistance with maintaining table top position throughout session.  She was given info on community Pilates classes and may decide to do long term.  Continuing to benefit from skilled PT intervention namely Pilates to improve her core.    PT Treatment/Interventions  Cryotherapy;Electrical Stimulation;Iontophoresis 64m/ml Dexamethasone;Aquatic Therapy;Moist Heat;Traction;Ultrasound;Gait training;Therapeutic activities;Therapeutic exercise;Neuromuscular re-education;Manual techniques;Orthotic Fit/Training;Passive range of motion;Dry needling;Joint Manipulations;Spinal Manipulations;Taping    PT Next Visit Plan  pilates core work    PT Home Exercise Plan  breathing, H abd , bird dog , bridge, butterfly bridge    Consulted and Agree with Plan of Care  Patient       Patient will benefit from skilled therapeutic  intervention in order to improve the following deficits and impairments:  Abnormal gait, Decreased balance, Decreased mobility, Decreased range of motion, Decreased strength, Difficulty walking, Increased muscle spasms, Increased fascial restricitons, Impaired flexibility, Postural dysfunction, Improper body mechanics, Pain, Obesity  Visit Diagnosis: 1. Chronic bilateral low back pain with left-sided sciatica   2. Other idiopathic scoliosis, thoracolumbar region        Problem List Patient Active Problem List   Diagnosis Date Noted  . Right otitis media 11/08/2016  . Otalgia of right ear 11/08/2016  . Influenza with respiratory manifestation other than pneumonia 07/13/2016  . Fever 07/13/2016  . Myalgia 07/13/2016  . Acute nonintractable headache 07/13/2016    Samantha Nicholson 12/13/2018, 12:25 PM  [facility] [address]Phone: [phone]  Fax:  [phone] Name: Samantha BRIGHAMMRN: [id number] of Birth: 727-Oct-1987 Samantha Nicholson PT 12/13/18 12:25 PM Phone: [phone]: [phone]

## 2018-12-18 ENCOUNTER — Other Ambulatory Visit: Payer: Self-pay

## 2018-12-18 ENCOUNTER — Encounter: Payer: Self-pay | Admitting: Physical Therapy

## 2018-12-18 ENCOUNTER — Ambulatory Visit: Payer: 59 | Admitting: Physical Therapy

## 2018-12-18 DIAGNOSIS — G8929 Other chronic pain: Secondary | ICD-10-CM

## 2018-12-18 DIAGNOSIS — M4125 Other idiopathic scoliosis, thoracolumbar region: Secondary | ICD-10-CM

## 2018-12-18 DIAGNOSIS — M5442 Lumbago with sciatica, left side: Secondary | ICD-10-CM | POA: Diagnosis not present

## 2018-12-18 NOTE — Therapy (Signed)
[place], [place], [address] Phone: [phone]   Fax:  [phone]  Physical Therapy Treatment  Patient Details  Name: Samantha Nicholson MRN: [id number] Date of Birth: 1986-03-19 Referring Provider (PT): Maryruth Hancock, MD   Encounter Date: 12/18/2018  PT End of Session - 12/18/18 1138    Visit Number  8    Number of Visits  16    Date for PT Re-Evaluation  01/15/19    Authorization Type  UHC    PT Start Time  7867    PT Stop Time  1215    PT Time Calculation (min)  44 min       Past Medical History:  Diagnosis Date  . Asthma     Past Surgical History:  Procedure Laterality Date  . MOUTH SURGERY    . TONSILLECTOMY      There were no vitals filed for this visit.  Subjective Assessment - 12/18/18 1137    Subjective  Patient fell Friday night playing kickball, badly bruised on abdomen.  Back spasmed alot on Saturday but is now resolved.  No pain today. KNee is a little sore.         [facility] PT Assessment - 12/18/18 0001      Observation/Other Assessments   Focus on Therapeutic Outcomes (FOTO)   28%      AROM   Lumbar Flexion  WFL    Lumbar Extension  [facility]    Lumbar - Right Side Bend  [facility]     Lumbar - Left Side Bend  WFL    Lumbar - Right Rotation  WFL    Lumbar - Left Rotation  [facility]      Strength   Overall Strength Comments  Rt hip abd 4+/5, Lt abd 4+/5           [facility] Adult PT Treatment/Exercise - 12/18/18 0001      Lumbar Exercises: Aerobic   Nustep  6 min L5 UE and LE       Lumbar Exercises: Supine   Dead Bug  10 reps    Basic Lumbar Stabilization Limitations  various: clam, knee raise and heel slide x 10 each     Straight Leg Raise  10 reps    Other Supine Lumbar Exercises  Foam roller exercises for all above     Other Supine Lumbar Exercises  horizonal pull red x 15 reps natural stance with LEs       Lumbar Exercises: Sidelying   Clam  Both;15 reps    Clam Limitations   blue band       Lumbar Exercises: Quadruped   Plank  modified 3 x 10 sec on elbows     Other Quadruped Lumbar Exercises  hip extension blue band bent knee x 10                PT Short Term Goals - 12/11/18 1300      PT SHORT TERM GOAL #1   Title  Pt will be I and compliant with HEP. 4 weeks 12/20/18    Status  Achieved      PT SHORT TERM GOAL #2   Title  Pt will reduce pain by 25%    Status  Achieved        PT Long Term Goals - 12/18/18 1143      PT LONG TERM GOAL #1   Title  Pt will improve lumbar ROM to [facility]. (Target for  all goals 8 weeks 01/15/19)    Status  Achieved      PT LONG TERM GOAL #2   Title  Pt will improve FOTO to less than 43% limited to show improved function    Baseline  28%    Status  Achieved      PT LONG TERM GOAL #3   Title  She will report improved pain by at least 50% with ususal activity and sleeping    Baseline  pt has had some improvement in sleeping - using a pillow to assist    Status  Partially Met      PT LONG TERM GOAL #4   Title  She will improve posture in order to improve functional abilities and decrease pain.    Status  On-going            Plan - 12/18/18 1138    Clinical Impression Statement  Patient did well today, utilized foam roller for more challenging core stabilization. Many goals met, FOTO limited only 28% from 60% on eval.  Continued to benefit from PT for core strength and stabilt, postural education.    Personal Factors and Comorbidities  Comorbidity 1    Comorbidities  significiant scoliosis    Examination-Activity Limitations  Carry;Transfers;Sit;Stairs;Stand;Lift;Sleep;Squat;Bend    Examination-Participation Restrictions  Meal Prep;Cleaning;Community Activity;Shop;Laundry    Stability/Clinical Decision Making  Evolving/Moderate complexity    PT Treatment/Interventions  Cryotherapy;Electrical Stimulation;Iontophoresis 5m/ml Dexamethasone;Aquatic Therapy;Moist Heat;Traction;Ultrasound;Gait  training;Therapeutic activities;Therapeutic exercise;Neuromuscular re-education;Manual techniques;Orthotic Fit/Training;Passive range of motion;Dry needling;Joint Manipulations;Spinal Manipulations;Taping    PT Next Visit Plan  Reformer, core work, postural    PT Home Exercise Plan  breathing, H abd , bird dog , bridge, butterfly bridge    Consulted and Agree with Plan of Care  Patient       Patient will benefit from skilled therapeutic intervention in order to improve the following deficits and impairments:  Abnormal gait, Decreased balance, Decreased mobility, Decreased range of motion, Decreased strength, Difficulty walking, Increased muscle spasms, Increased fascial restricitons, Impaired flexibility, Postural dysfunction, Improper body mechanics, Pain, Obesity  Visit Diagnosis: 1. Chronic bilateral low back pain with left-sided sciatica   2. Other idiopathic scoliosis, thoracolumbar region        Problem List Patient Active Problem List   Diagnosis Date Noted  . Right otitis media 11/08/2016  . Otalgia of right ear 11/08/2016  . Influenza with respiratory manifestation other than pneumonia 07/13/2016  . Fever 07/13/2016  . Myalgia 07/13/2016  . Acute nonintractable headache 07/13/2016    PAA,JENNIFER 12/18/2018, 1:01 PM  [facility] [address]Phone: [phone]  Fax:  [phone] Name: Samantha BASISTAMRN: [id number] of Birth: 707/08/1985 JRaeford Razor PT 12/18/18 1:01 PM Phone: [phone]: [phone]

## 2018-12-20 ENCOUNTER — Other Ambulatory Visit: Payer: Self-pay

## 2018-12-20 ENCOUNTER — Encounter: Payer: Self-pay | Admitting: Physical Therapy

## 2018-12-20 ENCOUNTER — Ambulatory Visit: Payer: 59 | Admitting: Physical Therapy

## 2018-12-20 DIAGNOSIS — G8929 Other chronic pain: Secondary | ICD-10-CM

## 2018-12-20 DIAGNOSIS — M4125 Other idiopathic scoliosis, thoracolumbar region: Secondary | ICD-10-CM

## 2018-12-20 DIAGNOSIS — M5442 Lumbago with sciatica, left side: Secondary | ICD-10-CM | POA: Diagnosis not present

## 2018-12-20 NOTE — Therapy (Signed)
[place], [place], [address] Phone: [phone]   Fax:  [phone]  Physical Therapy Treatment  Patient Details  Name: Samantha Nicholson MRN: [id number] Date of Birth: 11-12-1985 Referring Provider (PT): Maryruth Hancock, MD   Encounter Date: 12/20/2018  PT End of Session - 12/20/18 1133    Visit Number  9    Number of Visits  16    Date for PT Re-Evaluation  01/15/19    Authorization Type  UHC    PT Start Time  1130    PT Stop Time  1210    PT Time Calculation (min)  40 min    Activity Tolerance  Patient tolerated treatment well    Behavior During Therapy  [facility] for tasks assessed/performed       Past Medical History:  Diagnosis Date  . Asthma     Past Surgical History:  Procedure Laterality Date  . MOUTH SURGERY    . TONSILLECTOMY      There were no vitals filed for this visit.  Subjective Assessment - 12/20/18 1132    Subjective  No pain to report.  No new changes.    Diagnostic tests  XR show "42 degrees of levoscoliosis is seen involving the upper thoracic spine centered at T3 level. 42 degrees of dextroscoliosis is seen involving the lower thoracic spine centered at the T8-9 level. 25 degrees of levoscoliosis is seen involving the lumbar spine centered at the L2-3 level    Patient Stated Goals  get pain more manageble and straighten my back out    Currently in Pain?  No/denies             [facility] Adult PT Treatment/Exercise - 12/20/18 0001      Lumbar Exercises: Stretches   Active Hamstring Stretch  Right;Left;2 reps;30 seconds    ITB Stretch  Right;Left;2 reps      Lumbar Exercises: Aerobic   Nustep  6 min L5 UE and LE       Lumbar Exercises: Standing   Other Standing Lumbar Exercises  standing cat/cow       Lumbar Exercises: Quadruped   Madcat/Old Horse  10 reps    Single Arm Raise  5 reps    Straight Leg Raise  5 reps    Opposite Arm/Leg Raise  Right arm/Left leg;Left  arm/Right leg;10 reps    Other Quadruped Lumbar Exercises  childs pose x 2          Pilates Reformer used for LE/core strength, postural strength, lumbopelvic disassociation and core control.  Exercises included:  Plank on elbows 1 red LE press out x 10, unable to do UE and LE due to poor scapular stability and motor planning.  Seated Arms 1 Red roll down x 10, max cues  Taken off reformer and used black T band to simulate Able to do x 10 , difficulty with C -curve for abdominals, want to use arms to lift up, weakness       PT Education - 12/20/18 1211    Education Details  roll down, spine flex, extension in quad, seated    Person(s) Educated  Patient    Methods  Explanation;Demonstration;Verbal cues;Tactile cues;Handout    Comprehension  Verbalized understanding;Need further instruction;Verbal cues required;Tactile cues required       PT Short Term Goals - 12/11/18 1300      PT SHORT TERM GOAL #1   Title  Pt will be I and compliant with  HEP. 4 weeks 12/20/18    Status  Achieved      PT SHORT TERM GOAL #2   Title  Pt will reduce pain by 25%    Status  Achieved        PT Long Term Goals - 12/18/18 1143      PT LONG TERM GOAL #1   Title  Pt will improve lumbar ROM to [facility]. (Target for all goals 8 weeks 01/15/19)    Status  Achieved      PT LONG TERM GOAL #2   Title  Pt will improve FOTO to less than 43% limited to show improved function    Baseline  28%    Status  Achieved      PT LONG TERM GOAL #3   Title  She will report improved pain by at least 50% with ususal activity and sleeping    Baseline  pt has had some improvement in sleeping - using a pillow to assist    Status  Partially Met      PT LONG TERM GOAL #4   Title  She will improve posture in order to improve functional abilities and decrease pain.    Status  On-going            Plan - 12/20/18 1212    Clinical Impression Statement  Patient needed increased time for carry over of spinal flexion  from Reformer to standing to quadruped.  Shows improved stability and no pain.    Examination-Activity Limitations  Carry;Transfers;Sit;Stairs;Stand;Lift;Sleep;Squat;Bend    PT Treatment/Interventions  Cryotherapy;Electrical Stimulation;Iontophoresis 58m/ml Dexamethasone;Aquatic Therapy;Moist Heat;Traction;Ultrasound;Gait training;Therapeutic activities;Therapeutic exercise;Neuromuscular re-education;Manual techniques;Orthotic Fit/Training;Passive range of motion;Dry needling;Joint Manipulations;Spinal Manipulations;Taping    PT Next Visit Plan  Reformer, core work, postural    PT Home Exercise Plan  breathing, H abd , bird dog , bridge, butterfly bridge    Consulted and Agree with Plan of Care  Patient       Patient will benefit from skilled therapeutic intervention in order to improve the following deficits and impairments:  Abnormal gait, Decreased balance, Decreased mobility, Decreased range of motion, Decreased strength, Difficulty walking, Increased muscle spasms, Increased fascial restricitons, Impaired flexibility, Postural dysfunction, Improper body mechanics, Pain, Obesity  Visit Diagnosis: Chronic bilateral low back pain with left-sided sciatica  Other idiopathic scoliosis, thoracolumbar region     Problem List Patient Active Problem List   Diagnosis Date Noted  . Right otitis media 11/08/2016  . Otalgia of right ear 11/08/2016  . Influenza with respiratory manifestation other than pneumonia 07/13/2016  . Fever 07/13/2016  . Myalgia 07/13/2016  . Acute nonintractable headache 07/13/2016    PAA,JENNIFER 12/20/2018, 12:14 PM  [facility] [place] [address]Phone: [phone]  Fax:  [phone] Name: Samantha RODIERMRN: [id number] of Birth: [birth year]  Samantha Nicholson PT 12/20/18 12:16 PM Phone: [phone]: [phone]

## 2018-12-20 NOTE — Patient Instructions (Signed)
Half Roll-Down    Sit straight, legs bent, hands under thighs. Exhale, slowly rounding back halfway. Inhale, returning. Repeat __10__ times. Do ___1_ sessions per day. NOTE: Do not hunch shoulders.  http://pm.exer.us/3   Copyright  VHI. All rights reserved.

## 2018-12-25 ENCOUNTER — Other Ambulatory Visit: Payer: Self-pay

## 2018-12-25 ENCOUNTER — Ambulatory Visit: Payer: 59 | Admitting: Physical Therapy

## 2018-12-25 DIAGNOSIS — M5442 Lumbago with sciatica, left side: Secondary | ICD-10-CM | POA: Diagnosis not present

## 2018-12-25 DIAGNOSIS — G8929 Other chronic pain: Secondary | ICD-10-CM

## 2018-12-25 DIAGNOSIS — M4125 Other idiopathic scoliosis, thoracolumbar region: Secondary | ICD-10-CM

## 2018-12-25 NOTE — Therapy (Signed)
[place], [place], [address] Phone: [phone]   Fax:  [phone]  Physical Therapy Treatment/Discharge  Patient Details  Name: Samantha Nicholson MRN: [id number] Date of Birth: 10-Jul-1985 Referring Provider (PT): Maryruth Hancock, MD   Encounter Date: 12/25/2018  PT End of Session - 12/25/18 1136    Visit Number  10    Number of Visits  16    Date for PT Re-Evaluation  01/15/19    PT Start Time  1132    PT Stop Time  1210    PT Time Calculation (min)  38 min    Activity Tolerance  Patient tolerated treatment well    Behavior During Therapy  [facility] for tasks assessed/performed       Past Medical History:  Diagnosis Date  . Asthma     Past Surgical History:  Procedure Laterality Date  . MOUTH SURGERY    . TONSILLECTOMY      There were no vitals filed for this visit.  Subjective Assessment - 12/25/18 1146    Subjective  no pain.  Patient interested in community exercise transition. No problems lifting heavy furniture for grandmother the other day.    Diagnostic tests  XR show "42 degrees of levoscoliosis is seen involving the upper thoracic spine centered at T3 level. 42 degrees of dextroscoliosis is seen involving the lower thoracic spine centered at the T8-9 level. 25 degrees of levoscoliosis is seen involving the lumbar spine centered at the L2-3 level    Currently in Pain?  No/denies         [facility] PT Assessment - 12/25/18 0001      Observation/Other Assessments   Focus on Therapeutic Outcomes (FOTO)   28%      AROM   Lumbar Flexion  WFL    Lumbar Extension  WFL    Lumbar - Right Side Bend  [facility]     Lumbar - Left Side Bend  WFL    Lumbar - Right Rotation  WFL    Lumbar - Left Rotation  [facility]      Strength   Overall Strength Comments  Rt hip abd 4+/5, Lt abd 4+/5             [facility] Adult PT Treatment/Exercise - 12/25/18 0001      Self-Care   Other Self-Care Comments   Pilates class,  transition to studio , final HEP       Lumbar Exercises: Standing   Row  Strengthening;Both;20 reps;Theraband    Other Standing Lumbar Exercises  Paloff press blue band x 10 each       Lumbar Exercises: Seated   Other Seated Lumbar Exercises  seated roll down with black band       Lumbar Exercises: Supine   Clam  20 reps    Clam Limitations  bilateral with band, then unilateral     Bridge  10 reps    Bridge Limitations  with band     Bridge with clamshell  10 reps      Lumbar Exercises: Sidelying   Clam  10 reps    Clam Limitations  blue band       Lumbar Exercises: Quadruped   Madcat/Old Horse  10 reps    Single Arm Raise  5 reps    Straight Leg Raise  5 reps    Opposite Arm/Leg Raise  Right arm/Left leg;Left arm/Right leg;10 reps    Other Quadruped Lumbar Exercises  childs  pose x 2              PT Education - 12/25/18 1206    Education Details  Pilates studio, DC plan, more advanced HEP    Person(s) Educated  Patient    Methods  Explanation;Handout    Comprehension  Verbalized understanding;Returned demonstration       PT Short Term Goals - 12/25/18 1136      PT SHORT TERM GOAL #1   Title  Pt will be I and compliant with HEP. 4 weeks 12/20/18    Status  Achieved      PT SHORT TERM GOAL #2   Title  Pt will reduce pain by 25%    Status  Achieved        PT Long Term Goals - 12/25/18 1136      PT LONG TERM GOAL #1   Title  Pt will improve lumbar ROM to [facility]. (Target for all goals 8 weeks 01/15/19)    Status  Achieved      PT LONG TERM GOAL #2   Title  Pt will improve FOTO to less than 43% limited to show improved function    Status  Achieved      PT LONG TERM GOAL #3   Title  She will report improved pain by at least 50% with ususal activity and sleeping    Status  Achieved      PT LONG TERM GOAL #4   Title  She will improve posture in order to improve functional abilities and decrease pain.    Baseline  able to move furniture the other day without  pain    Status  Achieved            Plan - 12/25/18 1219    Clinical Impression Statement  Pt has met all LTGs and is ready to discharge and transition to Pilates class.  She is pleased with her progess, has not had pain in a long time.    Examination-Activity Limitations  Carry;Transfers;Sit;Stairs;Stand;Lift;Sleep;Squat;Bend    PT Treatment/Interventions  Cryotherapy;Electrical Stimulation;Iontophoresis 61m/ml Dexamethasone;Aquatic Therapy;Moist Heat;Traction;Ultrasound;Gait training;Therapeutic activities;Therapeutic exercise;Neuromuscular re-education;Manual techniques;Orthotic Fit/Training;Passive range of motion;Dry needling;Joint Manipulations;Spinal Manipulations;Taping    PT Next Visit Plan  DC, HEP    PT Home Exercise Plan  breathing, H abd , bird dog , bridge, butterfly bridge, row    Consulted and Agree with Plan of Care  Patient       Patient will benefit from skilled therapeutic intervention in order to improve the following deficits and impairments:  Abnormal gait, Decreased balance, Decreased mobility, Decreased range of motion, Decreased strength, Difficulty walking, Increased muscle spasms, Increased fascial restricitons, Impaired flexibility, Postural dysfunction, Improper body mechanics, Pain, Obesity  Visit Diagnosis: Chronic bilateral low back pain with left-sided sciatica  Other idiopathic scoliosis, thoracolumbar region     Problem List Patient Active Problem List   Diagnosis Date Noted  . Right otitis media 11/08/2016  . Otalgia of right ear 11/08/2016  . Influenza with respiratory manifestation other than pneumonia 07/13/2016  . Fever 07/13/2016  . Myalgia 07/13/2016  . Acute nonintractable headache 07/13/2016    Kirk Basquez 12/25/2018, 12:25 PM  [facility] [address]Phone: [phone]  Fax:  [phone] Name: ABRITTISH BOLINGERMRN: [id number] of Birth:  7Sep 12, 1987 PHYSICAL THERAPY DISCHARGE SUMMARY  Visits from Start of Care: 10  Current functional level related to goals / functional outcomes: See above  Remaining deficits: None limiting function   Education / Equipment: HEP, Pilates  Plan: Patient agrees to discharge.  Patient goals were met. Patient is being discharged due to meeting the stated rehab goals.  ?????    Raeford Razor, PT 12/25/18 12:26 PM Phone: [phone] Fax: [phone]

## 2018-12-27 ENCOUNTER — Ambulatory Visit: Payer: 59 | Admitting: Physical Therapy

## 2019-01-01 ENCOUNTER — Ambulatory Visit: Payer: 59 | Admitting: Physical Therapy

## 2019-01-03 ENCOUNTER — Ambulatory Visit: Payer: 59 | Admitting: Physical Therapy

## 2019-01-08 ENCOUNTER — Ambulatory Visit: Payer: 59 | Admitting: Physical Therapy

## 2019-01-10 ENCOUNTER — Ambulatory Visit: Payer: 59 | Admitting: Physical Therapy

## 2019-07-26 ENCOUNTER — Ambulatory Visit
Admission: EM | Admit: 2019-07-26 | Discharge: 2019-07-26 | Disposition: A | Discharge status: Home / Self Care | Payer: Self-pay | Attending: Family Medicine | Admitting: Family Medicine

## 2019-07-26 DIAGNOSIS — M62830 Muscle spasm of back: Secondary | ICD-10-CM

## 2019-07-26 HISTORY — DX: Scoliosis, unspecified: M41.9

## 2019-07-26 MED ORDER — CYCLOBENZAPRINE HCL 10 MG PO TABS: 10.0000 mg | ORAL_TABLET | Freq: Two times a day (BID) | ORAL | 0 refills | Status: DC | PRN

## 2019-07-26 NOTE — ED Provider Notes (Signed)
[facility]    CSN: [id number] Arrival date & time: 07/26/19  1339      History   Chief Complaint Chief Complaint  Patient presents with  . Back Pain    HPI Samantha Nicholson is a 34 y.o. female.   Patient is a 34 year old female presents today with right lower back pain and spasming that started today while she was at work.  Symptoms have been intermittent. Patient history of same.  Past medical history of scoliosis.  Denies any falls or injuries.  Does heavy lifting at work.  Denies any radiation of pain, numbness or tingling.  Denies any loss of bowel or bladder control.  She took ibuprofen with some relief.  ROS per HPI      Past Medical History:  Diagnosis Date  . Asthma   . Scoliosis     Patient Active Problem List   Diagnosis Date Noted  . Right otitis media 11/08/2016  . Otalgia of right ear 11/08/2016  . Influenza with respiratory manifestation other than pneumonia 07/13/2016  . Fever 07/13/2016  . Myalgia 07/13/2016  . Acute nonintractable headache 07/13/2016    Past Surgical History:  Procedure Laterality Date  . MOUTH SURGERY    . TONSILLECTOMY      OB History   No obstetric history on file.      Home Medications    Prior to Admission medications   Medication Sig Start Date End Date Taking? Authorizing Provider  Albuterol Sulfate (PROAIR RESPICLICK) 607 (90 Base) MCG/ACT AEPB Inhale 1 Inhaler into the lungs every 4 (four) hours as needed. 02/08/18   Wendie Agreste, MD  cyclobenzaprine (FLEXERIL) 10 MG tablet Take 1 tablet (10 mg total) by mouth 2 (two) times daily as needed for muscle spasms. 07/26/19   Loura Halt A, NP  etonogestrel (NEXPLANON) 68 MG IMPL implant Inject 1 each into the skin once.    [provider]    Family History Family History  Problem Relation Age of Onset  . Heart disease Father   . Kidney disease Father   . Diabetes Maternal Grandmother     Social History Social History   Tobacco Use    . Smoking status: Never Smoker  . Smokeless tobacco: Never Used  Substance Use Topics  . Alcohol use: No  . Drug use: No     Allergies   Patient has no known allergies.   Review of Systems Review of Systems   Physical Exam Triage Vital Signs ED Triage Vitals  Enc Vitals Group     BP 07/26/19 1422 132/75     Pulse Rate 07/26/19 1422 84     Resp 07/26/19 1422 16     Temp 07/26/19 1422 98.1 F (36.7 C)     Temp Source 07/26/19 1422 Oral     SpO2 07/26/19 1422 97 %     Weight --      Height --      Head Circumference --      Peak Flow --      Pain Score 07/26/19 1434 6     Pain Loc --      Pain Edu? --      Excl. in [place]? --    No data found.  Updated Vital Signs BP 132/75 (BP Location: Right Arm)   Pulse 84   Temp 98.1 F (36.7 C) (Oral)   Resp 16   SpO2 97%   Visual Acuity Right Eye Distance:   Left Eye  Distance:   Bilateral Distance:    Right Eye Near:   Left Eye Near:    Bilateral Near:     Physical Exam Vitals and nursing note reviewed.  Constitutional:      General: She is not in acute distress.    Appearance: Normal appearance. She is not ill-appearing, toxic-appearing or diaphoretic.  HENT:     Head: Normocephalic.     Nose: Nose normal.  Eyes:     Conjunctiva/sclera: Conjunctivae normal.  Pulmonary:     Effort: Pulmonary effort is normal.  Musculoskeletal:        General: Normal range of motion.     Cervical back: Normal range of motion.     Lumbar back: Tenderness present. No bony tenderness. Normal range of motion. Scoliosis present.       Back:  Skin:    General: Skin is warm and dry.     Findings: No rash.  Neurological:     Mental Status: She is alert.  Psychiatric:        Mood and Affect: Mood normal.      UC Treatments / Results  Labs (all labs ordered are listed, but only abnormal results are displayed) Labs Reviewed - No data to display  EKG   Radiology No results found.  Procedures Procedures (including  critical care time)  Medications Ordered in UC Medications - No data to display  Initial Impression / Assessment and Plan / UC Course  I have reviewed the triage vital signs and the nursing notes.  Pertinent labs & imaging results that were available during my care of the patient were reviewed by me and considered in my medical decision making (see chart for details).     Muscle spasm of lower back Treating with flexeril Heat, massage and stretching.  Ibuprofen or aleve as needed.  Follow up as needed for continued or worsening symptoms   Final Clinical Impressions(s) / UC Diagnoses   Final diagnoses:  Spasm of back muscles     Discharge Instructions     Use the Flexeril as needed for muscle spasming. Heat gentle stretching and massage.  Do your exercises  Ibuprofen or Aleve as needed. Light duty x1 week    ED Prescriptions    Medication Sig Dispense Auth. Provider   cyclobenzaprine (FLEXERIL) 10 MG tablet Take 1 tablet (10 mg total) by mouth 2 (two) times daily as needed for muscle spasms. 20 tablet Dahlia Byes A, NP     PDMP not reviewed this encounter.   Janace Aris, NP 07/26/19 [phone]

## 2019-07-26 NOTE — Discharge Instructions (Signed)
Use the Flexeril as needed for muscle spasming. Heat gentle stretching and massage.  Do your exercises  Ibuprofen or Aleve as needed. Light duty x1 week

## 2019-07-26 NOTE — ED Triage Notes (Signed)
Pt c/o rt lower back spasms that started today while walking. Denies injury

## 2020-04-15 ENCOUNTER — Ambulatory Visit
Admission: EM | Admit: 2020-04-15 | Discharge: 2020-04-15 | Disposition: A | Discharge status: Home / Self Care | Payer: 59 | Attending: Emergency Medicine | Admitting: Emergency Medicine

## 2020-04-15 ENCOUNTER — Other Ambulatory Visit: Payer: Self-pay

## 2020-04-15 DIAGNOSIS — Z20822 Contact with and (suspected) exposure to covid-19: Secondary | ICD-10-CM

## 2020-04-15 DIAGNOSIS — J069 Acute upper respiratory infection, unspecified: Secondary | ICD-10-CM

## 2020-04-15 DIAGNOSIS — Z1152 Encounter for screening for COVID-19: Secondary | ICD-10-CM

## 2020-04-15 MED ORDER — FLUTICASONE PROPIONATE 50 MCG/ACT NA SUSP: 1.0000 | Freq: Every day | NASAL | 0 refills | Status: DC

## 2020-04-15 MED ORDER — CETIRIZINE HCL 10 MG PO CAPS: 10.0000 mg | ORAL_CAPSULE | Freq: Every day | ORAL | 0 refills | Status: DC

## 2020-04-15 MED ORDER — BENZONATATE 200 MG PO CAPS: 200.0000 mg | ORAL_CAPSULE | Freq: Three times a day (TID) | ORAL | 0 refills | Status: AC | PRN

## 2020-04-15 NOTE — ED Triage Notes (Signed)
Pt c/o nasal congestion, cough, sore throat, and chest congestion x2 days.

## 2020-04-15 NOTE — Discharge Instructions (Signed)
COVID test pending, monitor my chart for results Begin daily cetirizine and Flonase to help with sinus congestion, pressure and drainage Tessalon for cough as needed May use other over-the-counter medicines for cough and congestion if preferred Ibuprofen and Tylenol for any headaches, body aches, sore throat Rest and fluids Follow-up if not improving or worsening

## 2020-04-16 NOTE — ED Provider Notes (Signed)
[facility]    CSN: [id number] Arrival date & time: 04/15/20  1717      History   Chief Complaint Chief Complaint  Patient presents with  . Nasal Congestion    HPI Samantha Nicholson is a 34 y.o. female history of asthma presenting today for evaluation of URI symptoms.  Reports cough congestion sore throat for approximately 2 days.  Reports associated sinus pressure.  Denies any fevers.  Denies any known exposures.  HPI  Past Medical History:  Diagnosis Date  . Asthma   . Scoliosis     Patient Active Problem List   Diagnosis Date Noted  . Right otitis media 11/08/2016  . Otalgia of right ear 11/08/2016  . Influenza with respiratory manifestation other than pneumonia 07/13/2016  . Fever 07/13/2016  . Myalgia 07/13/2016  . Acute nonintractable headache 07/13/2016    Past Surgical History:  Procedure Laterality Date  . MOUTH SURGERY    . TONSILLECTOMY      OB History   No obstetric history on file.      Home Medications    Prior to Admission medications   Medication Sig Start Date End Date Taking? Authorizing Provider  Albuterol Sulfate (PROAIR RESPICLICK) 108 (90 Base) MCG/ACT AEPB Inhale 1 Inhaler into the lungs every 4 (four) hours as needed. 02/08/18   Shade Flood, MD  benzonatate (TESSALON) 200 MG capsule Take 1 capsule (200 mg total) by mouth 3 (three) times daily as needed for up to 7 days for cough. 04/15/20 04/22/20  Katrisha Segall C, PA-C  Cetirizine HCl 10 MG CAPS Take 1 capsule (10 mg total) by mouth daily for 10 days. 04/15/20 04/25/20  Justine Dines C, PA-C  etonogestrel (NEXPLANON) 68 MG IMPL implant Inject 1 each into the skin once.    [provider]  fluticasone (FLONASE) 50 MCG/ACT nasal spray Place 1-2 sprays into both nostrils daily. 04/15/20   Kaelen Brennan, Junius Creamer, PA-C    Family History Family History  Problem Relation Age of Onset  . Heart disease Father   . Kidney disease Father   . Diabetes Maternal  Grandmother     Social History Social History   Tobacco Use  . Smoking status: Never Smoker  . Smokeless tobacco: Never Used  Substance Use Topics  . Alcohol use: No  . Drug use: No     Allergies   Patient has no known allergies.   Review of Systems Review of Systems  Constitutional: Negative for activity change, appetite change, chills, fatigue and fever.  HENT: Positive for congestion, rhinorrhea, sinus pressure and sore throat. Negative for ear pain and trouble swallowing.   Eyes: Negative for discharge and redness.  Respiratory: Positive for cough. Negative for chest tightness and shortness of breath.   Cardiovascular: Negative for chest pain.  Gastrointestinal: Negative for abdominal pain, diarrhea, nausea and vomiting.  Musculoskeletal: Negative for myalgias.  Skin: Negative for rash.  Neurological: Negative for dizziness, light-headedness and headaches.     Physical Exam Triage Vital Signs ED Triage Vitals [04/15/20 1936]  Enc Vitals Group     BP (!) 151/103     Pulse Rate 85     Resp 18     Temp 98.7 F (37.1 C)     Temp Source Oral     SpO2 97 %     Weight      Height      Head Circumference      Peak Flow  Pain Score 3     Pain Loc      Pain Edu?      Excl. in GC?    No data found.  Updated Vital Signs BP (!) 151/103 (BP Location: Left Arm)   Pulse 85   Temp 98.7 F (37.1 C) (Oral)   Resp 18   SpO2 97%   Visual Acuity Right Eye Distance:   Left Eye Distance:   Bilateral Distance:    Right Eye Near:   Left Eye Near:    Bilateral Near:     Physical Exam Vitals and nursing note reviewed.  Constitutional:      Appearance: She is well-developed and well-nourished.     Comments: No acute distress  HENT:     Head: Normocephalic and atraumatic.     Ears:     Comments: Bilateral ears without tenderness to palpation of external auricle, tragus and mastoid, EAC's without erythema or swelling, TM's with good bony landmarks and cone of  light. Non erythematous.     Nose: Nose normal.     Mouth/Throat:     Comments: Oral mucosa pink and moist, no tonsillar enlargement or exudate. Posterior pharynx patent and nonerythematous, no uvula deviation or swelling. Normal phonation. Eyes:     Conjunctiva/sclera: Conjunctivae normal.  Cardiovascular:     Rate and Rhythm: Normal rate.  Pulmonary:     Effort: Pulmonary effort is normal. No respiratory distress.     Comments: Breathing comfortably at rest, CTABL, no wheezing, rales or other adventitious sounds auscultated Abdominal:     General: There is no distension.  Musculoskeletal:        General: Normal range of motion.     Cervical back: Neck supple.  Skin:    General: Skin is warm and dry.  Neurological:     Mental Status: She is alert and oriented to person, place, and time.  Psychiatric:        Mood and Affect: Mood and affect normal.      UC Treatments / Results  Labs (all labs ordered are listed, but only abnormal results are displayed) Labs Reviewed  NOVEL CORONAVIRUS, NAA    EKG   Radiology No results found.  Procedures Procedures (including critical care time)  Medications Ordered in UC Medications - No data to display  Initial Impression / Assessment and Plan / UC Course  I have reviewed the triage vital signs and the nursing notes.  Pertinent labs & imaging results that were available during my care of the patient were reviewed by me and considered in my medical decision making (see chart for details).     2 days of URI symptoms, exam reassuring, vital signs stable.  Covid test pending.  Recommending symptomatic and supportive care with close monitoring.  Rest and fluids. Discussed strict return precautions. Patient verbalized understanding and is agreeable with plan.   Final Clinical Impressions(s) / UC Diagnoses   Final diagnoses:  Encounter for screening laboratory testing for COVID-19 virus  Viral URI with cough     Discharge  Instructions     COVID test pending, monitor my chart for results Begin daily cetirizine and Flonase to help with sinus congestion, pressure and drainage Tessalon for cough as needed May use other over-the-counter medicines for cough and congestion if preferred Ibuprofen and Tylenol for any headaches, body aches, sore throat Rest and fluids Follow-up if not improving or worsening   ED Prescriptions    Medication Sig Dispense Auth. Provider   Cetirizine  HCl 10 MG CAPS Take 1 capsule (10 mg total) by mouth daily for 10 days. 10 capsule Mckynzi Cammon C, PA-C   fluticasone (FLONASE) 50 MCG/ACT nasal spray Place 1-2 sprays into both nostrils daily. 16 g Latiqua Daloia C, PA-C   benzonatate (TESSALON) 200 MG capsule Take 1 capsule (200 mg total) by mouth 3 (three) times daily as needed for up to 7 days for cough. 28 capsule Duquan Gillooly, [place] C, PA-C     PDMP not reviewed this encounter.   Lew Dawes, [place] 04/16/20 [phone]

## 2020-04-17 LAB — NOVEL CORONAVIRUS, NAA: SARS-CoV-2, NAA: NOT DETECTED

## 2020-04-17 LAB — SARS-COV-2, NAA 2 DAY TAT

## 2021-06-04 IMAGING — DX DG SCOLIOSIS EVAL COMPLETE SPINE 1V
1 series · 2 of 2 positions shown · non-contrast
Comparison: Radiographs November 19, 2012.

CLINICAL DATA: Scoliosis.

EXAM:
DG SCOLIOSIS EVAL COMPLETE SPINE 1V

[Series 1: dg scoliosis ap · U · 0.14mm/px · 2 of 2 slices shown]
[im 1/2]
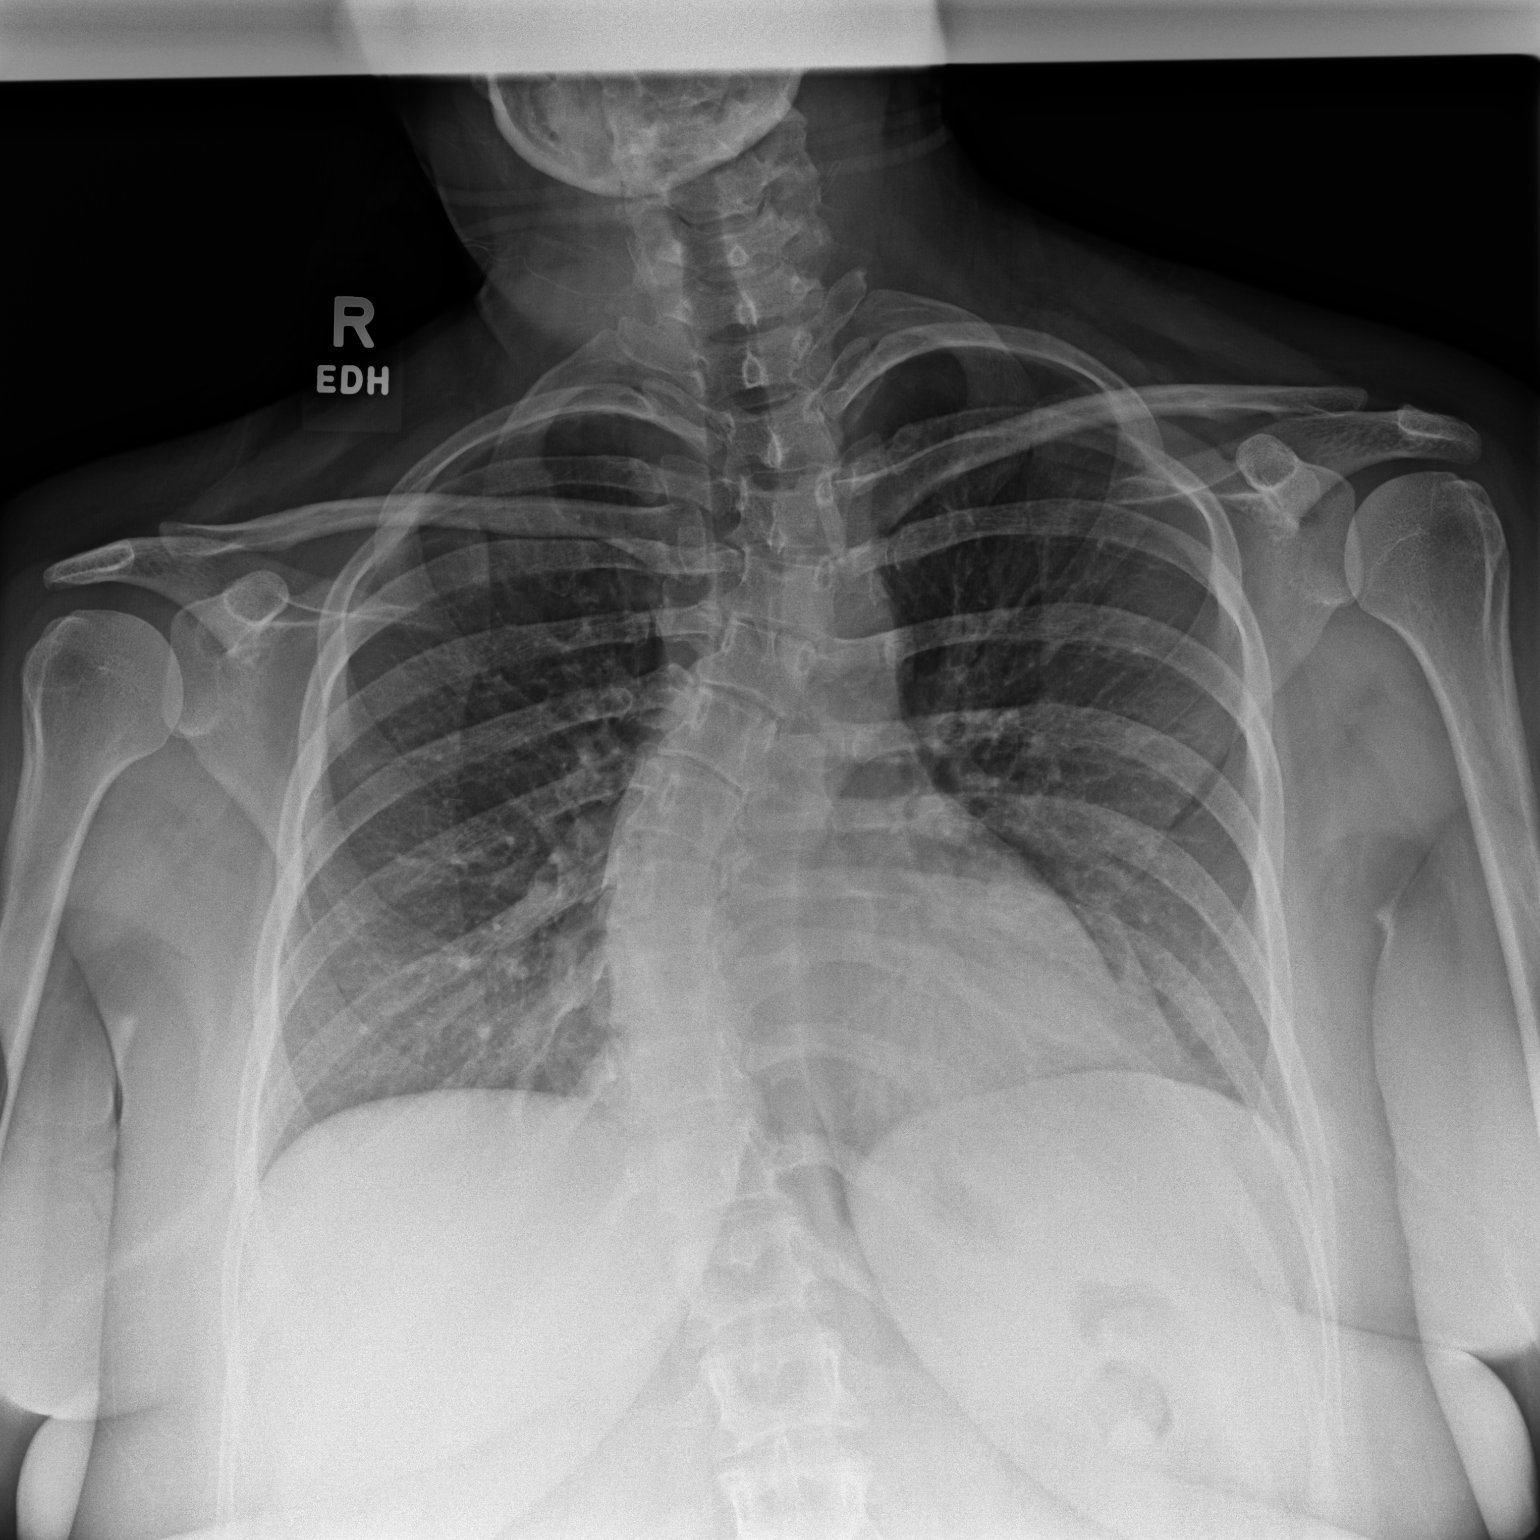
[im 2/2]
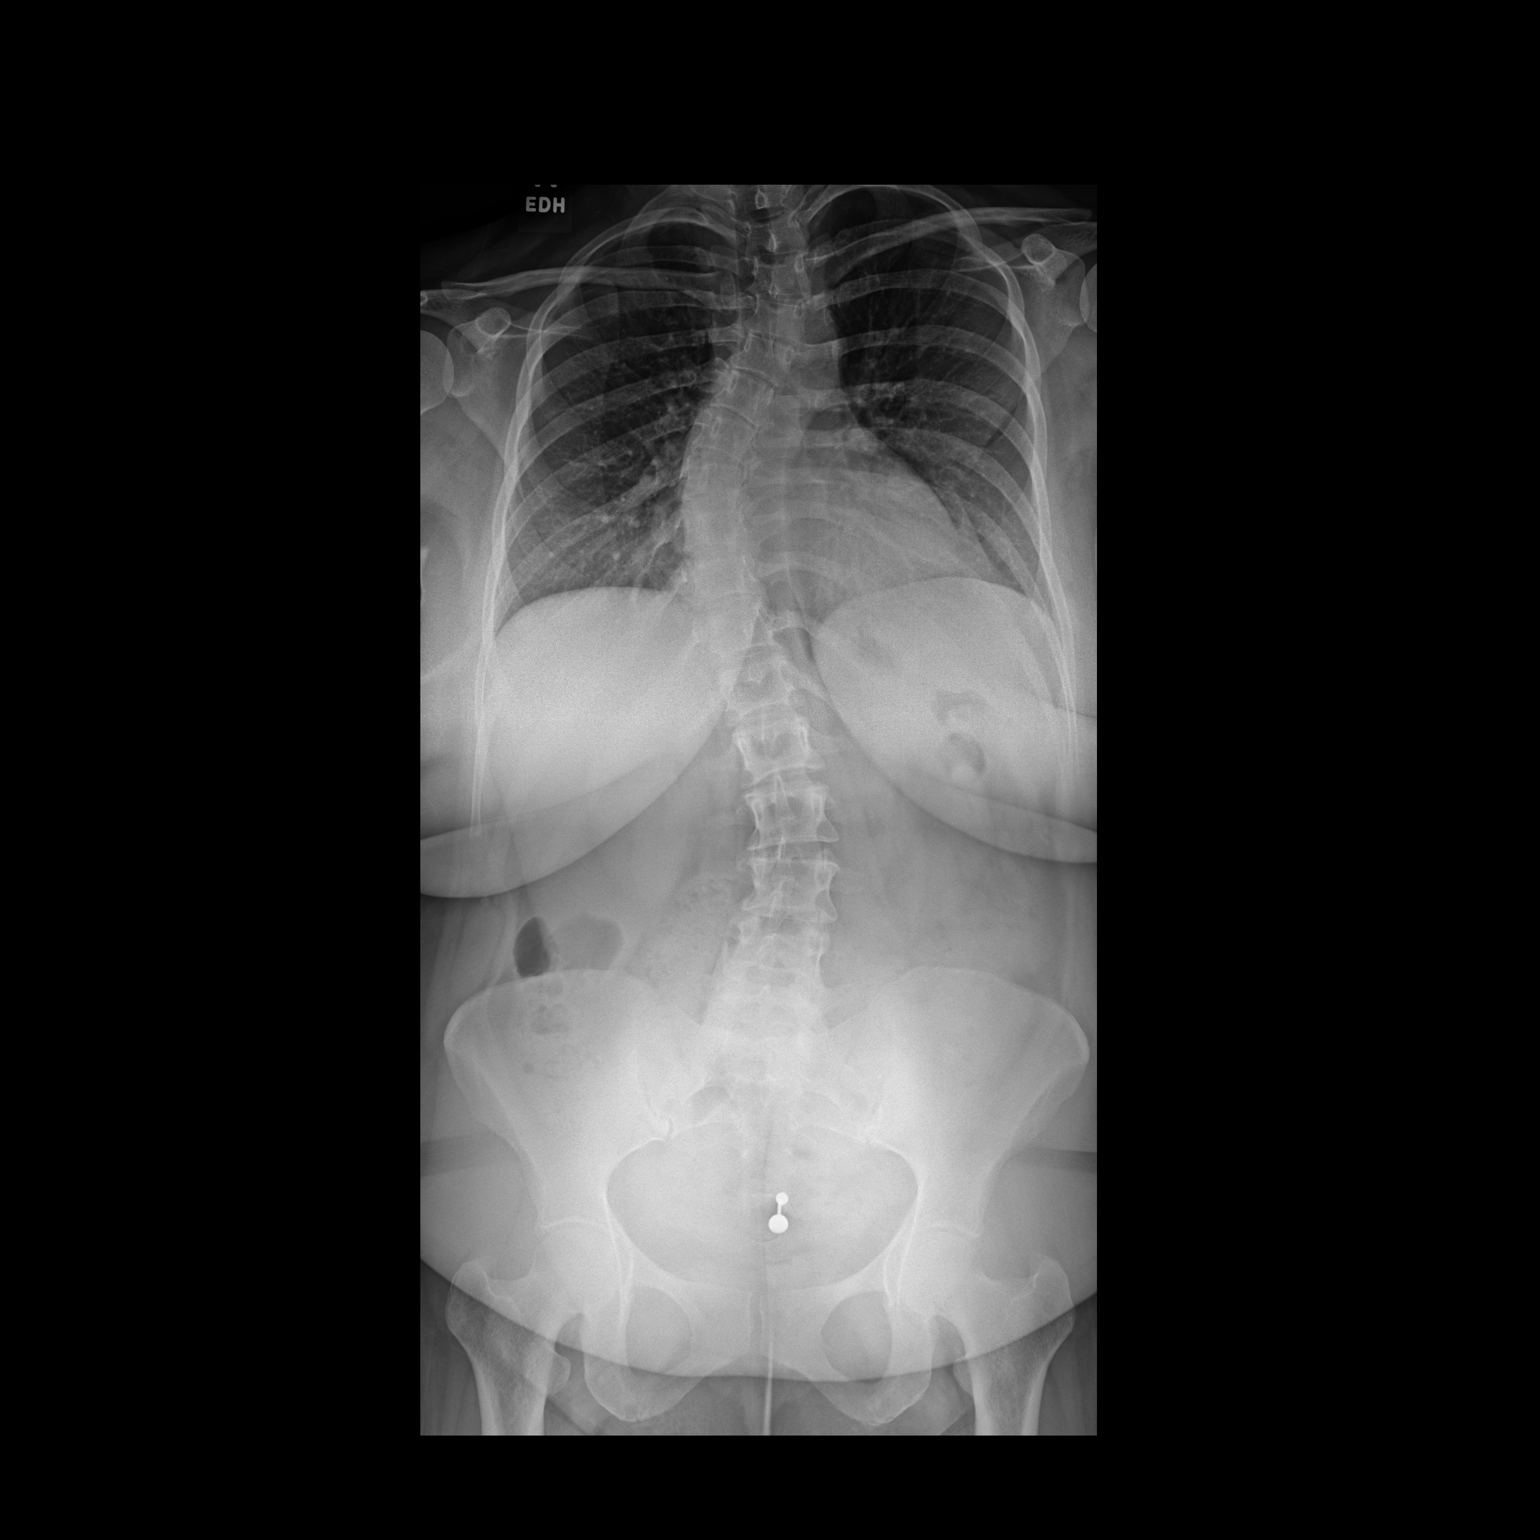

[2 of 2 positions shown; findings below may reference images not displayed]

FINDINGS: 42 degrees of levoscoliosis is seen involving the upper thoracic
spine centered at T3 level. 42 degrees of dextroscoliosis is seen
involving the lower thoracic spine centered at the T8-9 level. 25
degrees of levoscoliosis is seen involving the lumbar spine centered
at the L2-3 level. No fracture or other bony abnormality is noted.
IMPRESSION: Significant scoliosis is seen involving the thoracic and lumbar
spine as described above.

## 2021-08-24 ENCOUNTER — Ambulatory Visit: Payer: Self-pay | Admitting: Nurse Practitioner

## 2021-08-31 NOTE — Progress Notes (Deleted)
Complete Physical  Assessment and Plan:  Discussed med's effects and SE's. Screening labs and tests as requested with regular follow-up as recommended. Over 40 minutes of exam, counseling, chart review, and complex, high level critical decision making was performed this visit.   HPI  36 y.o. female  presents for a complete physical and follow up for has Influenza with respiratory manifestation other than pneumonia; Fever; Myalgia; Acute nonintractable headache; Right otitis media; and Otalgia of right ear on their problem list..  Her blood pressure {HAS HAS NOT:18834} been controlled at home, today their BP is   She {DOES_DOES SAY:30160} workout. She denies chest pain, shortness of breath, dizziness.   She {ACTION; IS/IS FUX:32355732} on cholesterol medication and denies myalgias. Her cholesterol {ACTION; IS/IS NOT:21021397} at goal. The cholesterol last visit was:  No results found for: CHOL, HDL, LDLCALC, LDLDIRECT, TRIG, CHOLHDL  She {Has/has not:18111} been working on diet and exercise for ***prediabetes, she {ACTION; IS/IS NOT:21021397} on bASA, she {ACTION; IS/IS KGU:54270623} on ACE/ARB and denies {Symptoms; diabetes w/o none:19199}. Last A1C in the office was: No results found for: HGBA1C  Last GFR: Lab Results  Component Value Date   GFRNONAA >90 11/18/2012   Lab Results  Component Value Date   GFRAA >90 11/18/2012    Patient is on Vitamin D supplement.   No results found for: VD25OH    Current Medications:  Current Outpatient Medications on File Prior to Visit  Medication Sig Dispense Refill   Albuterol Sulfate (PROAIR RESPICLICK) 108 (90 Base) MCG/ACT AEPB Inhale 1 Inhaler into the lungs every 4 (four) hours as needed. 1 each 0   Cetirizine HCl 10 MG CAPS Take 1 capsule (10 mg total) by mouth daily for 10 days. 10 capsule 0   etonogestrel (NEXPLANON) 68 MG IMPL implant Inject 1 each into the skin once.     fluticasone (FLONASE) 50 MCG/ACT nasal spray Place 1-2 sprays  into both nostrils daily. 16 g 0   No current facility-administered medications on file prior to visit.   Allergies:  No Known Allergies Medical History:  She has Influenza with respiratory manifestation other than pneumonia; Fever; Myalgia; Acute nonintractable headache; Right otitis media; and Otalgia of right ear on their problem list. Health Maintenance:   Immunization History  Administered Date(s) Administered   Tdap 08/21/2018    Tetanus: Pneumovax: Prevnar 13:  Flu vaccine: Zostavax: LMP: Pap: MGM:  DEXA: Colonoscopy: EGD:  Last Dental Exam: Last Eye Exam: Patient Care Team: Doristine Bosworth, MD as PCP - General (Internal Medicine)  Surgical History:  She has a past surgical history that includes Tonsillectomy and Mouth surgery. Family History:  Herfamily history includes Diabetes in her maternal grandmother; Heart disease in her father; Kidney disease in her father. Social History:  She reports that she has never smoked. She has never used smokeless tobacco. She reports that she does not drink alcohol and does not use drugs.  Review of Systems: ROS  Physical Exam: Estimated body mass index is 34.71 kg/m as calculated from the following:   Height as of 11/06/18: 5\' 4"  (1.626 m).   Weight as of 11/06/18: 202 lb 3.2 oz (91.7 kg). There were no vitals taken for this visit. General Appearance: Well nourished, in no apparent distress.  Eyes: PERRLA, EOMs, conjunctiva no swelling or erythema, normal fundi and vessels.  Sinuses: No Frontal/maxillary tenderness  ENT/Mouth: Ext aud canals clear, normal light reflex with TMs without erythema, bulging. Good dentition. No erythema, swelling, or exudate on post pharynx.  Tonsils not swollen or erythematous. Hearing normal.  Neck: Supple, thyroid normal. No bruits  Respiratory: Respiratory effort normal, BS equal bilaterally without rales, rhonchi, wheezing or stridor.  Cardio: RRR without murmurs, rubs or gallops. Brisk  peripheral pulses without edema.  Chest: symmetric, with normal excursions and percussion.  Breasts: Symmetric, without lumps, nipple discharge, retractions.  Abdomen: Positive bowel sounds all 4 quadrants, Soft, nontender, no guarding, rebound, hernias, masses, or organomegaly.  Lymphatics: Non tender without lymphadenopathy.  Genitourinary: External genitalia without erythema, exudate or discharge. Cervix noraml color without lesions, os closed.  Uterus of normal size and nontender. No CMT noted. Adnexa without masses or tenderness. Musculoskeletal: Full ROM all peripheral extremities,5/5 strength, and normal gait.  Skin: Warm, dry without rashes, lesions, ecchymosis. Neuro: Cranial nerves intact, reflexes equal bilaterally. Normal muscle tone, no cerebellar symptoms. Sensation intact.  Psych: Awake and oriented X 3, normal affect, Insight and Judgment appropriate.   EKG: WNL no ST changes. AORTA SCAN: WNL   Samantha Nicholson 3:47 PM [facility]

## 2021-09-02 ENCOUNTER — Ambulatory Visit: Payer: 59 | Admitting: Nurse Practitioner

## 2021-09-02 DIAGNOSIS — Z130 Encounter for screening for diseases of the blood and blood-forming organs and certain disorders involving the immune mechanism: Secondary | ICD-10-CM

## 2021-09-02 DIAGNOSIS — Z1329 Encounter for screening for other suspected endocrine disorder: Secondary | ICD-10-CM

## 2021-09-02 DIAGNOSIS — Z79899 Other long term (current) drug therapy: Secondary | ICD-10-CM

## 2021-09-02 DIAGNOSIS — Z1389 Encounter for screening for other disorder: Secondary | ICD-10-CM

## 2021-09-02 DIAGNOSIS — Z13220 Encounter for screening for lipoid disorders: Secondary | ICD-10-CM

## 2021-09-02 DIAGNOSIS — E559 Vitamin D deficiency, unspecified: Secondary | ICD-10-CM

## 2021-09-02 DIAGNOSIS — Z0001 Encounter for general adult medical examination with abnormal findings: Secondary | ICD-10-CM

## 2021-09-02 DIAGNOSIS — Z131 Encounter for screening for diabetes mellitus: Secondary | ICD-10-CM

## 2021-10-19 ENCOUNTER — Ambulatory Visit: Payer: 59 | Admitting: Nurse Practitioner

## 2021-11-15 NOTE — Progress Notes (Deleted)
 ESTABLISH CARE NEW PATIENT  Assessment and Plan:  Discussed med's effects and SE's. Screening labs and tests as requested with regular follow-up as recommended. Over 40 minutes of exam, counseling, chart review, and complex, high level critical decision making was performed this visit.   HPI  36 y.o. female  presents for a complete physical and follow up for has Influenza with respiratory manifestation other than pneumonia; Fever; Myalgia; Acute nonintractable headache; Right otitis media; and Otalgia of right ear on their problem list..  Her blood pressure {HAS HAS NOT:18834} been controlled at home, today their BP is   She {DOES_DOES JFH:54562} workout. She denies chest pain, shortness of breath, dizziness.   She {ACTION; IS/IS BWL:89373428} on cholesterol medication and denies myalgias. Her cholesterol {ACTION; IS/IS NOT:21021397} at goal. The cholesterol last visit was:  No results found for: "CHOL", "HDL", "LDLCALC", "LDLDIRECT", "TRIG", "CHOLHDL"  She {Has/has not:18111} been working on diet and exercise for ***prediabetes, she {ACTION; IS/IS NOT:21021397} on bASA, she {ACTION; IS/IS JGO:11572620} on ACE/ARB and denies {Symptoms; diabetes w/o none:19199}. Last A1C in the office was: No results found for: "HGBA1C"  Last GFR: Lab Results  Component Value Date   GFRNONAA >90 11/18/2012   Lab Results  Component Value Date   GFRAA >90 11/18/2012    Patient is on Vitamin D supplement.   No results found for: "VD25OH"    Current Medications:  Current Outpatient Medications on File Prior to Visit  Medication Sig Dispense Refill   Albuterol Sulfate (PROAIR RESPICLICK) 108 (90 Base) MCG/ACT AEPB Inhale 1 Inhaler into the lungs every 4 (four) hours as needed. 1 each 0   Cetirizine HCl 10 MG CAPS Take 1 capsule (10 mg total) by mouth daily for 10 days. 10 capsule 0   etonogestrel (NEXPLANON) 68 MG IMPL implant Inject 1 each into the skin once.     fluticasone (FLONASE) 50 MCG/ACT nasal  spray Place 1-2 sprays into both nostrils daily. 16 g 0   No current facility-administered medications on file prior to visit.   Allergies:  No Known Allergies Medical History:  She has Influenza with respiratory manifestation other than pneumonia; Fever; Myalgia; Acute nonintractable headache; Right otitis media; and Otalgia of right ear on their problem list. Health Maintenance:   Immunization History  Administered Date(s) Administered   Tdap 08/21/2018    Tetanus: Pneumovax: Prevnar 13:  Flu vaccine: Zostavax: LMP: Pap: MGM:  DEXA: Colonoscopy: EGD:  Last Dental Exam: Last Eye Exam: Patient Care Team: Doristine Bosworth, MD as PCP - General (Internal Medicine)  Surgical History:  She has a past surgical history that includes Tonsillectomy and Mouth surgery. Family History:  Herfamily history includes Diabetes in her maternal grandmother; Heart disease in her father; Kidney disease in her father. Social History:  She reports that she has never smoked. She has never used smokeless tobacco. She reports that she does not drink alcohol and does not use drugs.  Review of Systems: ROS  Physical Exam: Estimated body mass index is 34.71 kg/m as calculated from the following:   Height as of 11/06/18: 5\' 4"  (1.626 m).   Weight as of 11/06/18: 202 lb 3.2 oz (91.7 kg). There were no vitals taken for this visit. General Appearance: Well nourished, in no apparent distress.  Eyes: PERRLA, EOMs, conjunctiva no swelling or erythema, normal fundi and vessels.  Sinuses: No Frontal/maxillary tenderness  ENT/Mouth: Ext aud canals clear, normal light reflex with TMs without erythema, bulging. Good dentition. No erythema, swelling, or exudate on  post pharynx. Tonsils not swollen or erythematous. Hearing normal.  Neck: Supple, thyroid normal. No bruits  Respiratory: Respiratory effort normal, BS equal bilaterally without rales, rhonchi, wheezing or stridor.  Cardio: RRR without murmurs, rubs  or gallops. Brisk peripheral pulses without edema.  Chest: symmetric, with normal excursions and percussion.  Breasts: Symmetric, without lumps, nipple discharge, retractions.  Abdomen: Positive bowel sounds all 4 quadrants, Soft, nontender, no guarding, rebound, hernias, masses, or organomegaly.  Lymphatics: Non tender without lymphadenopathy.  Genitourinary: External genitalia without erythema, exudate or discharge. Cervix noraml color without lesions, os closed.  Uterus of normal size and nontender. No CMT noted. Adnexa without masses or tenderness. Musculoskeletal: Full ROM all peripheral extremities,5/5 strength, and normal gait.  Skin: Warm, dry without rashes, lesions, ecchymosis. Neuro: Cranial nerves intact, reflexes equal bilaterally. Normal muscle tone, no cerebellar symptoms. Sensation intact.  Psych: Awake and oriented X 3, normal affect, Insight and Judgment appropriate.   EKG: WNL no ST changes. AORTA SCAN: WNL   Samantha Nicholson E  11:39 AM [facility]

## 2021-11-16 ENCOUNTER — Ambulatory Visit: Payer: 59 | Admitting: Nurse Practitioner

## 2021-12-02 NOTE — Progress Notes (Signed)
 ESTABLISH CARE NEW PATIENT PHYSICAL  Assessment and Plan:  Samantha Nicholson was seen today for establish care.  Diagnoses and all orders for this visit:  Encounter for general adult medical examination with abnormal findings -     CBC with Differential/Platelet -     COMPLETE METABOLIC PANEL WITH GFR -     Magnesium -     Lipid panel -     TSH -     Hemoglobin A1c -     VITAMIN D 25 Hydroxy (Vit-D Deficiency, Fractures) -     EKG 12-Lead -     Urinalysis, Routine w reflex microscopic -     Microalbumin / creatinine urine ratio -     Iron, Total/Total Iron Binding Cap -     Ferritin  Reactive airway disease without complication, unspecified asthma severity, unspecified whether persistent Continue albuterol inhaler as needed.  Add generic Zyrtec or Claritin Avoid triggers if possible -     Albuterol Sulfate (PROAIR RESPICLICK) 108 (90 Base) MCG/ACT AEPB; Inhale 2 puffs into the lungs every 4 (four) hours as needed.  Obesity (BMI 30.0-Samantha.9) Fair life protein shakes Eat more frequently - try not to go more than 6 hours without protein Aim for 90 grams of protein a day- 30 breakfast/30 lunch 30 dinner Try to keep net carbs less than 50 Net Carbs=Total Carbs-fiber- sugar alcohols Exercise heartrate 120-140(fat burning zone)- walking 20-30 minutes 4 days a week  Follow up in 3 months -     CBC with Differential/Platelet -     COMPLETE METABOLIC PANEL WITH GFR  Anxiety Was unable to tolerate Lexapro.  Begin Prozac 10 mg daily -     FLUoxetine (PROZAC) 10 MG capsule; Take 1 capsule (10 mg total) by mouth daily.  Screening for ischemic heart disease -     EKG 12-Lead  Screening for thyroid disorder -     TSH  Medication management  Screening for hematuria or proteinuria -     Urinalysis, Routine w reflex microscopic -     Microalbumin / creatinine urine ratio  Screening, lipid -     Lipid panel  Screening for diabetes mellitus -     Hemoglobin A1c  Screening, anemia,  deficiency, iron -     CBC with Differential/Platelet -     Iron, Total/Total Iron Binding Cap -     Ferritin  Vitamin D deficiency -     VITAMIN D 25 Hydroxy (Vit-D Deficiency, Fractures)  Scoliosis of lumbosacral spine, unspecified scoliosis type Has used muscle relaxants in the past to control pain- will try on Baclofen and monitor symptoms -     baclofen (LIORESAL) 10 MG tablet; Take 1 tablet (10 mg total) by mouth 2 (two) times daily. Take 1/2 to 1 tablet 2 x day if needed for muscle spasm    Discussed med's effects and SE's. Screening labs and tests as requested with regular follow-up as recommended. Over 40 minutes of exam, counseling, chart review, and complex, high level critical decision making was performed this visit.   HPI  Samantha Nicholson  presents for a establishment of care at new practice Today's Vitals   12/03/21 1105  BP: 118/82  Pulse: 89  Temp: (!) 97.3 F (36.3 C)  SpO2: 99%  Weight: 209 lb 8 oz (95 kg)  Height: 5\' 5"  (1.651 m)   Body mass index is Samantha.86 kg/m.   She has no teeth they were all pulled 06/2018.  She does not have dentures  currently.  She has been on Lexapro 20 mg and was making her sick to her stomach. She lowered the medication and still had nausea.  Stopped med 07/2021. Feels very overwhelmed and anxious. Has been able to control more currently. Her son is 90 and has autism  Her asthma has been a little more exacerbated.  Has not been taking a regular allergy medication  Has been needing to use inhaler more- needs refill.  Her blood pressure has been controlled at home, today their BP is BP: 118/82 She does not workout. She denies chest pain, shortness of breath, dizziness.   BMI is Body mass index is Samantha.86 kg/m., she has not been working on diet and exercise. Eats out a lot, eats a lot of soda and simple carbs, fried foods and red meat.  Wt Readings from Last 3 Encounters:  12/03/21 209 lb 8 oz (95 kg)  11/06/18 202 lb 3.2 oz (91.7 kg)   10/23/18 200 lb (90.7 kg)   Not sure she has had any lab work for cholesterol or A1c recently  She does have bad back pain from scoliosis , has used muscle relaxants in the past with some relief of pain.   Last GFR: Lab Results  Component Value Date   GFRNONAA >90 11/18/2012   Lab Results  Component Value Date   GFRAA >90 11/18/2012    Patient is on Vitamin D supplement.   No results found for: "VD25OH"    Current Medications:  Current Outpatient Medications on File Prior to Visit  Medication Sig Dispense Refill   Albuterol Sulfate (PROAIR RESPICLICK) 108 (90 Base) MCG/ACT AEPB Inhale 1 Inhaler into the lungs every 4 (four) hours as needed. 1 each 0   etonogestrel (NEXPLANON) 68 MG IMPL implant Inject 1 each into the skin once.     Cetirizine HCl 10 MG CAPS Take 1 capsule (10 mg total) by mouth daily for 10 days. 10 capsule 0   fluticasone (FLONASE) 50 MCG/ACT nasal spray Place 1-2 sprays into both nostrils daily. (Patient not taking: Reported on 12/03/2021) 16 g 0   No current facility-administered medications on file prior to visit.   Allergies:  No Known Allergies Medical History:  She has Influenza with respiratory manifestation other than pneumonia; Fever; Myalgia; Acute nonintractable headache; Right otitis media; and Otalgia of right ear on their problem list. Health Maintenance:   Immunization History  Administered Date(s) Administered   Tdap 08/21/2018    Tetanus:2020   Last Dental Exam: Last Eye Exam: Patient Care Team: Lucky Cowboy, MD as PCP - General (Internal Medicine)  Surgical History:  She has a past surgical history that includes Tonsillectomy and Mouth surgery. Family History:  Herfamily history includes Asthma in her brother and daughter; Diabetes in her maternal grandmother; Diabetes Mellitus II in her father; Epilepsy in her son; Heart attack in her father; Heart disease in her father; Hypertension in her brother; Kidney disease in her  father; Migraines in her brother and daughter. Social History:  She reports that she has never smoked. She has never used smokeless tobacco. She reports that she does not drink alcohol and does not use drugs.  Review of Systems: Review of Systems  Constitutional:  Negative for chills and fever.       Difficulty losing weight  HENT:  Negative for congestion, hearing loss, sinus pain, sore throat and tinnitus.   Eyes:  Negative for blurred vision and double vision.  Respiratory:  Positive for shortness of breath (on exertion  inhaler resolves). Negative for cough, hemoptysis, sputum production and wheezing.   Cardiovascular:  Negative for chest pain, palpitations and leg swelling.  Gastrointestinal:  Negative for abdominal pain, constipation, diarrhea, heartburn, nausea and vomiting.  Genitourinary:  Negative for dysuria and urgency.  Musculoskeletal:  Positive for back pain (scoliosis). Negative for falls, joint pain, myalgias and neck pain.  Skin:  Negative for rash.  Neurological:  Negative for dizziness, tingling, tremors, weakness and headaches.  Endo/Heme/Allergies:  Does not bruise/bleed easily.  Psychiatric/Behavioral:  Negative for depression and suicidal ideas. The patient is nervous/anxious. The patient does not have insomnia.     Physical Exam: Estimated body mass index is Samantha.86 kg/m as calculated from the following:   Height as of this encounter: 5\' 5"  (1.651 m).   Weight as of this encounter: 209 lb 8 oz (95 kg). BP 118/82   Pulse 89   Temp (!) 97.3 F (36.3 C)   Ht 5\' 5"  (1.651 m)   Wt 209 lb 8 oz (95 kg)   SpO2 99%   BMI Samantha.86 kg/m  General Appearance: Obese pleasant Nicholson , in no apparent distress.  Eyes: PERRLA, EOMs, conjunctiva no swelling or erythema, normal fundi and vessels.  Sinuses: No Frontal/maxillary tenderness  ENT/Mouth: Ext aud canals clear, normal light reflex with TMs without erythema, bulging. Has no teeth or dentures. No erythema, swelling, or  exudate on post pharynx. Tonsils not swollen or erythematous. Hearing normal.  Neck: Supple, thyroid normal. No bruits  Respiratory: Respiratory effort normal, BS equal bilaterally without rales, rhonchi, wheezing or stridor.  Cardio: RRR without murmurs, rubs or gallops. Brisk peripheral pulses without edema.  Chest: symmetric, with normal excursions and percussion.   Abdomen: Positive bowel sounds all 4 quadrants, Soft, nontender, no guarding, rebound, hernias, masses, or organomegaly.  Lymphatics: Non tender without lymphadenopathy. . Musculoskeletal: Full ROM all peripheral extremities,5/5 strength, and normal gait. Scoliosis of thoracic and lumbar regions Skin: Warm, dry without rashes, lesions, ecchymosis. Neuro: Cranial nerves intact, reflexes equal bilaterally. Normal muscle tone, no cerebellar symptoms. Sensation intact.  Psych: Awake and oriented X 3, normal affect, Insight and Judgment appropriate.   EKG: NSR, no st changes    Daneil Beem E  11:20 AM [facility]

## 2021-12-03 ENCOUNTER — Ambulatory Visit: Payer: 59 | Admitting: Nurse Practitioner

## 2021-12-03 ENCOUNTER — Encounter: Payer: Self-pay | Admitting: Nurse Practitioner

## 2021-12-03 VITALS — BP 118/82 | HR 89 | Temp 97.3°F | Ht 65.0 in | Wt 209.5 lb

## 2021-12-03 DIAGNOSIS — E669 Obesity, unspecified: Secondary | ICD-10-CM

## 2021-12-03 DIAGNOSIS — I10 Essential (primary) hypertension: Secondary | ICD-10-CM | POA: Diagnosis not present

## 2021-12-03 DIAGNOSIS — Z13220 Encounter for screening for lipoid disorders: Secondary | ICD-10-CM

## 2021-12-03 DIAGNOSIS — Z79899 Other long term (current) drug therapy: Secondary | ICD-10-CM

## 2021-12-03 DIAGNOSIS — Z131 Encounter for screening for diabetes mellitus: Secondary | ICD-10-CM

## 2021-12-03 DIAGNOSIS — E559 Vitamin D deficiency, unspecified: Secondary | ICD-10-CM

## 2021-12-03 DIAGNOSIS — E66811 Obesity, class 1: Secondary | ICD-10-CM

## 2021-12-03 DIAGNOSIS — Z0000 Encounter for general adult medical examination without abnormal findings: Secondary | ICD-10-CM

## 2021-12-03 DIAGNOSIS — J45909 Unspecified asthma, uncomplicated: Secondary | ICD-10-CM

## 2021-12-03 DIAGNOSIS — G43909 Migraine, unspecified, not intractable, without status migrainosus: Secondary | ICD-10-CM | POA: Insufficient documentation

## 2021-12-03 DIAGNOSIS — Z1389 Encounter for screening for other disorder: Secondary | ICD-10-CM

## 2021-12-03 DIAGNOSIS — Z136 Encounter for screening for cardiovascular disorders: Secondary | ICD-10-CM | POA: Diagnosis not present

## 2021-12-03 DIAGNOSIS — Z130 Encounter for screening for diseases of the blood and blood-forming organs and certain disorders involving the immune mechanism: Secondary | ICD-10-CM

## 2021-12-03 DIAGNOSIS — F419 Anxiety disorder, unspecified: Secondary | ICD-10-CM

## 2021-12-03 DIAGNOSIS — M419 Scoliosis, unspecified: Secondary | ICD-10-CM

## 2021-12-03 DIAGNOSIS — Z0001 Encounter for general adult medical examination with abnormal findings: Secondary | ICD-10-CM

## 2021-12-03 DIAGNOSIS — Z1329 Encounter for screening for other suspected endocrine disorder: Secondary | ICD-10-CM

## 2021-12-03 MED ORDER — FLUOXETINE HCL 10 MG PO CAPS: 10.0000 mg | ORAL_CAPSULE | Freq: Every day | ORAL | 2 refills | Status: DC

## 2021-12-03 MED ORDER — BACLOFEN 10 MG PO TABS: 10.0000 mg | ORAL_TABLET | Freq: Two times a day (BID) | ORAL | 1 refills | Status: AC

## 2021-12-03 MED ORDER — PROAIR RESPICLICK 108 (90 BASE) MCG/ACT IN AEPB: 2.0000 | INHALATION_SPRAY | RESPIRATORY_TRACT | 0 refills | Status: DC | PRN

## 2021-12-03 NOTE — Patient Instructions (Signed)
Fair life protein shakes Eat more frequently - try not to go more than 6 hours without protein Aim for 90 grams of protein a day- 30 breakfast/30 lunch 30 dinner Try to keep net carbs less than 50 Net Carbs=Total Carbs-fiber- sugar alcohols Exercise heartrate 120-140(fat burning zone)- walking 20-30 minutes 4 days a week  

## 2021-12-04 LAB — COMPLETE METABOLIC PANEL WITH GFR
AG Ratio: 1.6 (calc) (ref 1.0–2.5)
ALT: 18 U/L (ref 6–29)
AST: 18 U/L (ref 10–30)
Albumin: 4.2 g/dL (ref 3.6–5.1)
Alkaline phosphatase (APISO): 79 U/L (ref 31–125)
BUN/Creatinine Ratio: 21 (calc) (ref 6–22)
BUN: 10 mg/dL (ref 7–25)
CO2: 28 mmol/L (ref 20–32)
Calcium: 9.1 mg/dL (ref 8.6–10.2)
Chloride: 104 mmol/L (ref 98–110)
Creat: 0.47 mg/dL — ABNORMAL LOW (ref 0.50–0.97)
Globulin: 2.7 g/dL (calc) (ref 1.9–3.7)
Glucose, Bld: 82 mg/dL (ref 65–99)
Potassium: 4.6 mmol/L (ref 3.5–5.3)
Sodium: 139 mmol/L (ref 135–146)
Total Bilirubin: 1 mg/dL (ref 0.2–1.2)
Total Protein: 6.9 g/dL (ref 6.1–8.1)
eGFR: 128 mL/min/{1.73_m2} (ref >=60)

## 2021-12-04 LAB — LIPID PANEL
Cholesterol: 178 mg/dL (ref <200)
HDL: 44 mg/dL — ABNORMAL LOW (ref >=50)
LDL Cholesterol (Calc): 116 mg/dL (calc) — ABNORMAL HIGH
Non-HDL Cholesterol (Calc): 134 mg/dL (calc) — ABNORMAL HIGH (ref <130)
Total CHOL/HDL Ratio: 4 (calc) (ref <5.0)
Triglycerides: 84 mg/dL (ref <150)

## 2021-12-04 LAB — HEMOGLOBIN A1C
Hgb A1c MFr Bld: 5.2 % of total Hgb (ref <5.7)
Mean Plasma Glucose: 103 mg/dL
eAG (mmol/L): 5.7 mmol/L

## 2021-12-04 LAB — URINALYSIS, ROUTINE W REFLEX MICROSCOPIC
Bilirubin Urine: NEGATIVE
Glucose, UA: NEGATIVE
Hgb urine dipstick: NEGATIVE
Ketones, ur: NEGATIVE
Leukocytes,Ua: NEGATIVE
Nitrite: NEGATIVE
Protein, ur: NEGATIVE
Specific Gravity, Urine: 1.024 (ref 1.001–1.035)
pH: 7 (ref 5.0–8.0)

## 2021-12-04 LAB — CBC WITH DIFFERENTIAL/PLATELET
Absolute Monocytes: 589 cells/uL (ref 200–950)
Basophils Absolute: 10 cells/uL (ref 0–200)
Basophils Relative: 0.1 %
Eosinophils Absolute: 143 cells/uL (ref 15–500)
Eosinophils Relative: 1.5 %
HCT: 42.8 % (ref 35.0–45.0)
Hemoglobin: 14.9 g/dL (ref 11.7–15.5)
Lymphs Abs: 2309 cells/uL (ref 850–3900)
MCH: 31.6 pg (ref 27.0–33.0)
MCHC: 34.8 g/dL (ref 32.0–36.0)
MCV: 90.9 fL (ref 80.0–100.0)
MPV: 11.4 fL (ref 7.5–12.5)
Monocytes Relative: 6.2 %
Neutro Abs: 6451 cells/uL (ref 1500–7800)
Neutrophils Relative %: 67.9 %
Platelets: 251 10*3/uL (ref 140–400)
RBC: 4.71 10*6/uL (ref 3.80–5.10)
RDW: 12.3 % (ref 11.0–15.0)
Total Lymphocyte: 24.3 %
WBC: 9.5 10*3/uL (ref 3.8–10.8)

## 2021-12-04 LAB — MAGNESIUM: Magnesium: 2.1 mg/dL (ref 1.5–2.5)

## 2021-12-04 LAB — MICROALBUMIN / CREATININE URINE RATIO
Creatinine, Urine: 146 mg/dL (ref 20–275)
Microalb Creat Ratio: 6 mcg/mg creat (ref <30)
Microalb, Ur: 0.9 mg/dL

## 2021-12-04 LAB — IRON, TOTAL/TOTAL IRON BINDING CAP
%SAT: 54 % (calc) — ABNORMAL HIGH (ref 16–45)
Iron: 132 ug/dL (ref 40–190)
TIBC: 245 mcg/dL (calc) — ABNORMAL LOW (ref 250–450)

## 2021-12-04 LAB — VITAMIN D 25 HYDROXY (VIT D DEFICIENCY, FRACTURES): Vit D, 25-Hydroxy: 12 ng/mL — ABNORMAL LOW (ref 30–100)

## 2021-12-04 LAB — TSH: TSH: 0.51 mIU/L

## 2021-12-04 LAB — FERRITIN: Ferritin: 49 ng/mL (ref 16–154)

## 2021-12-06 ENCOUNTER — Other Ambulatory Visit: Payer: Self-pay | Admitting: Nurse Practitioner

## 2021-12-06 ENCOUNTER — Telehealth: Payer: Self-pay | Admitting: Nurse Practitioner

## 2021-12-06 DIAGNOSIS — J45909 Unspecified asthma, uncomplicated: Secondary | ICD-10-CM

## 2021-12-06 MED ORDER — ALBUTEROL SULFATE HFA 108 (90 BASE) MCG/ACT IN AERS: 2.0000 | INHALATION_SPRAY | Freq: Four times a day (QID) | RESPIRATORY_TRACT | 2 refills | Status: AC | PRN

## 2021-12-06 NOTE — Telephone Encounter (Signed)
The inhaler that was sent it isn't covered by her insurance. Wanting an alternative sent to the [facility] on file if possible please

## 2022-03-04 NOTE — Progress Notes (Deleted)
 3 MONTH FOLLOW UP   Reactive airway disease without complication, unspecified asthma severity, unspecified whether persistent Continue albuterol inhaler as needed.  Add generic Zyrtec or Claritin Avoid triggers if possible -     Albuterol Sulfate (PROAIR RESPICLICK) 297 (90 Base) MCG/ACT AEPB; Inhale 2 puffs into the lungs every 4 (four) hours as needed.  Obesity (BMI 30.0-34.9) Fair life protein shakes Eat more frequently - try not to go more than 6 hours without protein Aim for 90 grams of protein a day- 30 breakfast/30 lunch 30 dinner Try to keep net carbs less than 50 Net Carbs=Total Carbs-fiber- sugar alcohols Exercise heartrate 120-140(fat burning zone)- walking 20-30 minutes 4 days a week  Follow up in 3 months -     CBC with Differential/Platelet -     COMPLETE METABOLIC PANEL WITH GFR  Anxiety Was unable to tolerate Lexapro.  Begin Prozac 10 mg daily -     FLUoxetine (PROZAC) 10 MG capsule; Take 1 capsule (10 mg total) by mouth daily.   Vitamin D deficiency -     VITAMIN D 25 Hydroxy (Vit-D Deficiency, Fractures)  Scoliosis of lumbosacral spine, unspecified scoliosis type Has used muscle relaxants in the past to control pain- will try on Baclofen and monitor symptoms -     baclofen (LIORESAL) 10 MG tablet; Take 1 tablet (10 mg total) by mouth 2 (two) times daily. Take 1/2 to 1 tablet 2 x day if needed for muscle spasm    Discussed med's effects and SE's. Screening labs and tests as requested with regular follow-up as recommended. Over 40 minutes of exam, counseling, chart review, and complex, high level critical decision making was performed this visit.   HPI  36 y.o. female  presents for a establishment of care at new practice There were no vitals filed for this visit.  There is no height or weight on file to calculate BMI.   She has no teeth they were all pulled 06/2018.  She does not have dentures currently.  She has been on Lexapro 20 mg and was making her sick  to her stomach. She lowered the medication and still had nausea.  Stopped med 07/2021. Feels very overwhelmed and anxious. Has been able to control more currently. Her son is 28 and has autism  Her asthma has been a little more exacerbated.  Has not been taking a regular allergy medication  Has been needing to use inhaler more- needs refill.  Her blood pressure has been controlled at home, today their BP is   She does not workout. She denies chest pain, shortness of breath, dizziness.   BMI is There is no height or weight on file to calculate BMI., she has not been working on diet and exercise. Eats out a lot, eats a lot of soda and simple carbs, fried foods and red meat.  Wt Readings from Last 3 Encounters:  12/03/21 209 lb 8 oz (95 kg)  11/06/18 202 lb 3.2 oz (91.7 kg)  10/23/18 200 lb (90.7 kg)   Not sure she has had any lab work for cholesterol or A1c recently  She does have bad back pain from scoliosis , has used muscle relaxants in the past with some relief of pain.   Last GFR: Lab Results  Component Value Date   GFRNONAA >90 11/18/2012   Lab Results  Component Value Date   GFRAA >90 11/18/2012    Patient is on Vitamin D supplement.   Lab Results  Component Value Date  VD25OH 12 (L) 12/03/2021      Current Medications:  Current Outpatient Medications on File Prior to Visit  Medication Sig Dispense Refill   albuterol (VENTOLIN HFA) 108 (90 Base) MCG/ACT inhaler Inhale 2 puffs into the lungs every 6 (six) hours as needed for wheezing or shortness of breath. 8 g 2   baclofen (LIORESAL) 10 MG tablet Take 1 tablet (10 mg total) by mouth 2 (two) times daily. Take 1/2 to 1 tablet 2 x day if needed for muscle spasm 60 tablet 1   Cetirizine HCl 10 MG CAPS Take 1 capsule (10 mg total) by mouth daily for 10 days. 10 capsule 0   etonogestrel (NEXPLANON) 68 MG IMPL implant Inject 1 each into the skin once.     FLUoxetine (PROZAC) 10 MG capsule Take 1 capsule (10 mg total) by mouth  daily. 30 capsule 2   No current facility-administered medications on file prior to visit.   Allergies:  No Known Allergies Medical History:  She has Influenza with respiratory manifestation other than pneumonia; Fever; Myalgia; Acute nonintractable headache; Right otitis media; Otalgia of right ear; Anxiety; Asthma; Migraine headache; and Scoliosis on their problem list. Health Maintenance:   Immunization History  Administered Date(s) Administered   Tdap 08/21/2018    Tetanus:2020   Last Dental Exam: Last Eye Exam: Patient Care Team: Lucky Cowboy, MD as PCP - General (Internal Medicine)  Surgical History:  She has a past surgical history that includes Tonsillectomy and Mouth surgery. Family History:  Herfamily history includes Asthma in her brother and daughter; Diabetes in her maternal grandmother; Diabetes Mellitus II in her father; Epilepsy in her son; Heart attack in her father; Heart disease in her father; Hypertension in her brother; Kidney disease in her father; Migraines in her brother and daughter. Social History:  She reports that she has never smoked. She has never used smokeless tobacco. She reports that she does not drink alcohol and does not use drugs.  Review of Systems: Review of Systems  Constitutional:  Negative for chills and fever.       Difficulty losing weight  HENT:  Negative for congestion, hearing loss, sinus pain, sore throat and tinnitus.   Eyes:  Negative for blurred vision and double vision.  Respiratory:  Positive for shortness of breath (on exertion inhaler resolves). Negative for cough, hemoptysis, sputum production and wheezing.   Cardiovascular:  Negative for chest pain, palpitations and leg swelling.  Gastrointestinal:  Negative for abdominal pain, constipation, diarrhea, heartburn, nausea and vomiting.  Genitourinary:  Negative for dysuria and urgency.  Musculoskeletal:  Positive for back pain (scoliosis). Negative for falls, joint pain,  myalgias and neck pain.  Skin:  Negative for rash.  Neurological:  Negative for dizziness, tingling, tremors, weakness and headaches.  Endo/Heme/Allergies:  Does not bruise/bleed easily.  Psychiatric/Behavioral:  Negative for depression and suicidal ideas. The patient is nervous/anxious. The patient does not have insomnia.     Physical Exam: Estimated body mass index is 34.86 kg/m as calculated from the following:   Height as of 12/03/21: 5\' 5"  (1.651 m).   Weight as of 12/03/21: 209 lb 8 oz (95 kg). There were no vitals taken for this visit. General Appearance: Obese pleasant female , in no apparent distress.  Eyes: PERRLA, EOMs, conjunctiva no swelling or erythema, normal fundi and vessels.  Sinuses: No Frontal/maxillary tenderness  ENT/Mouth: Ext aud canals clear, normal light reflex with TMs without erythema, bulging. Has no teeth or dentures. No erythema, swelling, or exudate  on post pharynx. Tonsils not swollen or erythematous. Hearing normal.  Neck: Supple, thyroid normal. No bruits  Respiratory: Respiratory effort normal, BS equal bilaterally without rales, rhonchi, wheezing or stridor.  Cardio: RRR without murmurs, rubs or gallops. Brisk peripheral pulses without edema.  Chest: symmetric, with normal excursions and percussion.   Abdomen: Positive bowel sounds all 4 quadrants, Soft, nontender, no guarding, rebound, hernias, masses, or organomegaly.  Lymphatics: Non tender without lymphadenopathy. . Musculoskeletal: Full ROM all peripheral extremities,5/5 strength, and normal gait. Scoliosis of thoracic and lumbar regions Skin: Warm, dry without rashes, lesions, ecchymosis. Neuro: Cranial nerves intact, reflexes equal bilaterally. Normal muscle tone, no cerebellar symptoms. Sensation intact.  Psych: Awake and oriented X 3, normal affect, Insight and Judgment appropriate.   EKG: NSR, no st changes    Therma Lasure E  9:45 AM [facility]

## 2022-03-07 ENCOUNTER — Ambulatory Visit: Payer: 59 | Admitting: Nurse Practitioner

## 2022-03-07 DIAGNOSIS — Z79899 Other long term (current) drug therapy: Secondary | ICD-10-CM

## 2022-03-07 DIAGNOSIS — M419 Scoliosis, unspecified: Secondary | ICD-10-CM

## 2022-03-07 DIAGNOSIS — E669 Obesity, unspecified: Secondary | ICD-10-CM

## 2022-03-07 DIAGNOSIS — J45909 Unspecified asthma, uncomplicated: Secondary | ICD-10-CM

## 2022-03-07 DIAGNOSIS — F419 Anxiety disorder, unspecified: Secondary | ICD-10-CM

## 2022-03-07 DIAGNOSIS — E559 Vitamin D deficiency, unspecified: Secondary | ICD-10-CM

## 2022-03-07 DIAGNOSIS — E782 Mixed hyperlipidemia: Secondary | ICD-10-CM

## 2022-03-14 ENCOUNTER — Encounter: Payer: Self-pay | Admitting: Nurse Practitioner

## 2022-03-14 ENCOUNTER — Ambulatory Visit (INDEPENDENT_AMBULATORY_CARE_PROVIDER_SITE_OTHER): Payer: 59 | Admitting: Nurse Practitioner

## 2022-03-14 ENCOUNTER — Other Ambulatory Visit: Payer: Self-pay

## 2022-03-14 VITALS — HR 103 | Temp 98.0°F | Ht 65.0 in | Wt 205.0 lb

## 2022-03-14 DIAGNOSIS — R5081 Fever presenting with conditions classified elsewhere: Secondary | ICD-10-CM

## 2022-03-14 DIAGNOSIS — J101 Influenza due to other identified influenza virus with other respiratory manifestations: Secondary | ICD-10-CM | POA: Diagnosis not present

## 2022-03-14 DIAGNOSIS — R0981 Nasal congestion: Secondary | ICD-10-CM | POA: Diagnosis not present

## 2022-03-14 DIAGNOSIS — G4483 Primary cough headache: Secondary | ICD-10-CM | POA: Diagnosis not present

## 2022-03-14 DIAGNOSIS — R0602 Shortness of breath: Secondary | ICD-10-CM | POA: Diagnosis not present

## 2022-03-14 LAB — POC INFLUENZA A&B (BINAX/QUICKVUE)
Influenza A, POC: NEGATIVE
Influenza B, POC: POSITIVE — AB

## 2022-03-14 LAB — POC COVID19 BINAXNOW: SARS Coronavirus 2 Ag: NEGATIVE

## 2022-03-14 MED ORDER — PROMETHAZINE-DM 6.25-15 MG/5ML PO SYRP: 5.0000 mL | ORAL_SOLUTION | Freq: Four times a day (QID) | ORAL | 0 refills | Status: DC | PRN

## 2022-03-14 MED ORDER — PREDNISONE 10 MG PO TABS: ORAL_TABLET | ORAL | 0 refills | Status: DC

## 2022-03-14 NOTE — Progress Notes (Signed)
Virtual telephone visit    Assessment & Plan:    1. Influenza B Positive  2. Cough headache Stay well hydrated Start Promethazine cough syrup as directed.  - POC Influenza A&B (Binax test) - POC COVID-19 - promethazine-dextromethorphan (PROMETHAZINE-DM) 6.25-15 MG/5ML syrup; Take 5 mLs by mouth 4 (four) times daily as needed for cough.  Dispense: 240 mL; Refill: 0  3. Nasal congestion Continue Mucinex Stay well hydrated to keep mucus thin and productive.  4. Fever in other diseases Tylenol as directed  5. SOB (shortness of breath) Continue Albuterol inhaler Start Prednisone taper. Call 911 for any increase in difficulty breathing.  - predniSONE (DELTASONE) 10 MG tablet; 1 tab 3 x day for 2 days, then 1 tab 2 x day for 2 days, then 1 tab 1 x day for 3 days  Dispense: 13 tablet; Refill: 0    I discussed the assessment and treatment plan with the patient. The patient was provided an opportunity to ask questions and all were answered. The patient agreed with the plan and demonstrated an understanding of the instructions.   The patient was advised to call back or seek an in-person evaluation if the symptoms worsen or if the condition fails to improve as anticipated.  I provided 15 minutes of non-face-to-face time during this encounter. Virtual Visit via Telephone Note   This visit type was conducted due to national recommendations for restrictions regarding the COVID-19 Pandemic (e.g. social distancing) in an effort to limit this patient's exposure and mitigate transmission in our community. Due to her co-morbid illnesses, this patient is at least at moderate risk for complications without adequate follow up. This format is felt to be most appropriate for this patient at this time. The patient did not have access to video technology or had technical difficulties with video requiring transitioning to audio format only (telephone). Physical exam was limited to content and character  of the telephone converstion.    Patient location: Home Provider location: Office    Patient: Samantha Nicholson   DOB: Apr 04, 1986   36 y.o. Female  MRN: [id number] Visit Date: 03/14/2022  Today's Provider: Adela Glimpse, NP  Subjective:    Chief Complaint  Patient presents with   Cough   HPI Subjective:     Samantha Nicholson is a 36 y.o. female who presents for evaluation of influenza like symptoms. Symptoms include chest congestion, headache, productive cough, shortness of breath, and fever and have been present for 5 days. She has tried to alleviate the symptoms with acetaminophen and mucinex, tylenol cold an flu, robitussin  with minimal relief. High risk factors for influenza complications include asthma.  She has been using albuterol PRN.    Objective:    Pulse (!) 103   Temp 98 F (36.7 C)   Ht 5\' 5"  (1.651 m)   Wt 205 lb (93 kg)   SpO2 99%   BMI 34.11 kg/m   Medications: Outpatient Medications Prior to Visit  Medication Sig   albuterol (VENTOLIN HFA) 108 (90 Base) MCG/ACT inhaler Inhale 2 puffs into the lungs every 6 (six) hours as needed for wheezing or shortness of breath.   baclofen (LIORESAL) 10 MG tablet Take 1 tablet (10 mg total) by mouth 2 (two) times daily. Take 1/2 to 1 tablet 2 x day if needed for muscle spasm   etonogestrel (NEXPLANON) 68 MG IMPL implant Inject 1 each into the skin once.   FLUoxetine (PROZAC) 10 MG capsule Take 1 capsule (10 mg total) by  mouth daily.   Cetirizine HCl 10 MG CAPS Take 1 capsule (10 mg total) by mouth daily for 10 days.   No facility-administered medications prior to visit.    Darrol Jump, NP  [facility] [phone] (phone) [phone] (fax)

## 2022-03-16 ENCOUNTER — Ambulatory Visit: Payer: 59 | Admitting: Nurse Practitioner

## 2022-04-08 ENCOUNTER — Ambulatory Visit: Payer: 59 | Admitting: Nurse Practitioner

## 2022-04-13 DIAGNOSIS — E66811 Obesity, class 1: Secondary | ICD-10-CM | POA: Insufficient documentation

## 2022-04-13 DIAGNOSIS — E782 Mixed hyperlipidemia: Secondary | ICD-10-CM | POA: Insufficient documentation

## 2022-04-13 DIAGNOSIS — E669 Obesity, unspecified: Secondary | ICD-10-CM | POA: Insufficient documentation

## 2022-04-13 NOTE — Progress Notes (Signed)
FOLLOW UP  Assessment and Plan:   Iron deficiency Anemia  Hyperlipidemia Currently at goal;  Continue low cholesterol diet and exercise.  Check lipid panel.   Anxiety Currently on Prozac 10 mg Practice good sleep hygiene, diet and exercise  Obesity with co morbidities Long discussion about weight loss, diet, and exercise Recommended diet heavy in fruits and veggies and low in animal meats, cheeses, and dairy products, appropriate calorie intake Work on decreasing saturated fats, simple carbs and increasing activity Will follow up in 3 months  Vitamin D Def At goal at last visit; continue supplementation to maintain goal of 60-100 -Vit D level  Medication Management - CBC, CMP  Nexplanon in place Wants removed  Refer to GYN   Continue diet and meds as discussed. Further disposition pending results of labs. Discussed med's effects and SE's.   Over 30 minutes of exam, counseling, chart review, and critical decision making was performed.   No future appointments.   ----------------------------------------------------------------------------------------------------------------------  HPI 36 y.o. female  presents for 3 month follow up on hypertension, cholesterol, diabetes, weight and vitamin D deficiency.   She has Nexplanon that has been in place x 6 years and was placed at [facility].  She wants removed and wants referral to GYN.   She is currently on Prozac 10 mg daily  She is having increased stress.  Her brother and mother are in the hospital. Her mother is in rehab due to kidney dysfunction.  Brother is hospital going to rehab, fell and hit head.   BMI is Body mass index is 35.64 kg/m., she has not been working on diet and exercise.Has not been currently exercising, lots of stress Wt Readings from Last 3 Encounters:  04/15/22 214 lb 3.2 oz (97.2 kg)  03/14/22 205 lb (93 kg)  12/03/21 209 lb 8 oz (95 kg)    Her blood pressure has been controlled at  home, today their BP is BP: 112/78  BP Readings from Last 3 Encounters:  04/15/22 112/78  12/03/21 118/82  04/15/20 (!) 151/103   She does not workout. She denies chest pain, shortness of breath, dizziness.   She is not on cholesterol medication.Her cholesterol is not at goal. The cholesterol last visit was:   Lab Results  Component Value Date   CHOL 178 12/03/2021   HDL 44 (L) 12/03/2021   LDLCALC 116 (H) 12/03/2021   TRIG 84 12/03/2021   CHOLHDL 4.0 12/03/2021    She has not been working on diet and exercise for abnormal glucose. Last A1C in the office was:  Lab Results  Component Value Date   HGBA1C 5.2 12/03/2021   Patient is on Vitamin D supplement.   Lab Results  Component Value Date   VD25OH 12 (L) 12/03/2021        Current Medications:  Current Outpatient Medications on File Prior to Visit  Medication Sig   albuterol (VENTOLIN HFA) 108 (90 Base) MCG/ACT inhaler Inhale 2 puffs into the lungs every 6 (six) hours as needed for wheezing or shortness of breath.   baclofen (LIORESAL) 10 MG tablet Take 1 tablet (10 mg total) by mouth 2 (two) times daily. Take 1/2 to 1 tablet 2 x day if needed for muscle spasm   Cholecalciferol (VITAMIN D-3 PO) Take by mouth.   etonogestrel (NEXPLANON) 68 MG IMPL implant Inject 1 each into the skin once.   FLUoxetine (PROZAC) 10 MG capsule Take 1 capsule (10 mg total) by mouth daily.   Cetirizine HCl 10 MG  CAPS Take 1 capsule (10 mg total) by mouth daily for 10 days.   predniSONE (DELTASONE) 10 MG tablet 1 tab 3 x day for 2 days, then 1 tab 2 x day for 2 days, then 1 tab 1 x day for 3 days (Patient not taking: Reported on 04/15/2022)   promethazine-dextromethorphan (PROMETHAZINE-DM) 6.25-15 MG/5ML syrup Take 5 mLs by mouth 4 (four) times daily as needed for cough. (Patient not taking: Reported on 04/15/2022)   No current facility-administered medications on file prior to visit.     Allergies: No Known Allergies   Medical History:  Past  Medical History:  Diagnosis Date   Anxiety    Asthma    Migraine headache    Scoliosis    Family history- Reviewed and unchanged Social history- Reviewed and unchanged   Review of Systems:  Review of Systems  Constitutional:  Negative for chills, fever and weight loss.  HENT:  Negative for congestion and hearing loss.   Eyes:  Negative for blurred vision and double vision.  Respiratory:  Negative for cough and shortness of breath.   Cardiovascular:  Negative for chest pain, palpitations, orthopnea and leg swelling.  Gastrointestinal:  Negative for abdominal pain, constipation, diarrhea, heartburn, nausea and vomiting.  Musculoskeletal:  Negative for falls, joint pain and myalgias.  Skin:  Negative for rash.  Neurological:  Negative for dizziness, tingling, tremors, loss of consciousness and headaches.  Psychiatric/Behavioral:  Negative for depression, memory loss and suicidal ideas.       Physical Exam: BP 112/78   Pulse 85   Temp 97.7 F (36.5 C)   Ht 5\' 5"  (1.651 m)   Wt 214 lb 3.2 oz (97.2 kg)   SpO2 97%   BMI 35.64 kg/m  Wt Readings from Last 3 Encounters:  04/15/22 214 lb 3.2 oz (97.2 kg)  03/14/22 205 lb (93 kg)  12/03/21 209 lb 8 oz (95 kg)   General Appearance: Obese pleasant female, in no apparent distress. Eyes: PERRLA, EOMs, conjunctiva no swelling or erythema Sinuses: No Frontal/maxillary tenderness ENT/Mouth: Ext aud canals clear, TMs without erythema, bulging. No erythema, swelling, or exudate on post pharynx Missing all teeth Neck: Supple, thyroid normal.  Respiratory: Respiratory effort normal, BS equal bilaterally without rales, rhonchi, wheezing or stridor.  Cardio: RRR with no MRGs. Brisk peripheral pulses without edema.  Abdomen: Soft, + BS.  Non tender, no guarding, rebound, hernias, masses. Lymphatics: Non tender without lymphadenopathy.  Musculoskeletal: Full ROM, 5/5 strength, Normal gait Skin: Warm, dry without rashes, lesions, ecchymosis.   Neuro: Cranial nerves intact. No cerebellar symptoms.  Psych: Awake and oriented X 3, normal affect, Insight and Judgment appropriate.    02/02/22, NP 11:43 AM [facility]

## 2022-04-15 ENCOUNTER — Ambulatory Visit (INDEPENDENT_AMBULATORY_CARE_PROVIDER_SITE_OTHER): Payer: 59 | Admitting: Nurse Practitioner

## 2022-04-15 ENCOUNTER — Encounter: Payer: Self-pay | Admitting: Nurse Practitioner

## 2022-04-15 VITALS — BP 112/78 | HR 85 | Temp 97.7°F | Ht 65.0 in | Wt 214.2 lb

## 2022-04-15 DIAGNOSIS — E559 Vitamin D deficiency, unspecified: Secondary | ICD-10-CM | POA: Diagnosis not present

## 2022-04-15 DIAGNOSIS — F419 Anxiety disorder, unspecified: Secondary | ICD-10-CM | POA: Diagnosis not present

## 2022-04-15 DIAGNOSIS — Z975 Presence of (intrauterine) contraceptive device: Secondary | ICD-10-CM

## 2022-04-15 DIAGNOSIS — D509 Iron deficiency anemia, unspecified: Secondary | ICD-10-CM

## 2022-04-15 DIAGNOSIS — E669 Obesity, unspecified: Secondary | ICD-10-CM

## 2022-04-15 DIAGNOSIS — E782 Mixed hyperlipidemia: Secondary | ICD-10-CM

## 2022-04-15 DIAGNOSIS — Z79899 Other long term (current) drug therapy: Secondary | ICD-10-CM

## 2022-04-15 NOTE — Patient Instructions (Signed)
Diet Modification:  The main components of a healthy diet include 3 small meals with 3 healthy snakes throughout each day.  Your should not go more than 4 hours without eating a small snack or meal.  This helps avoid "starvation" and decreased the urge to choose unhealthy foods. The following guidelines should be followed through the day: Focus on portion size and control  It is important to read nutrition labels so that you are aware of the appropriate serving size For those foods that do not have a nutrition label, you can generally use your had as a guide for the appropriate servings sizes.  Fist 1 cup or medium whole fruit  Thumb (tip to base) 1 ounce of meat or chees  Thumb tip (tip to 1st joint) 1 tablespoon  Fingertip (tip to 1st joint) 1 teaspoon  Cupped hand 1-2 ounces of nuts, pretzels, popcorn  Palm (minus fingers) 3 ounces of meat, fish or poultry   Eat breakfast daily to increase metabolism and prevent loss of control later in the day. Research has repeatedly shown that patients who eat breakfast daily lose more weight and keep the weight off more effectively than patients who skip breakfast. One serving of high fiber cereal (> 3 grams of fiber) with 1 cup of skim milk. One serving of old fashioned oatmeal ( not instant) made with skim milk.  May add 1/2 cup chopped fruit an sugar substitute for added flavor. Two egg omelet made with egg beaters. May add 1 tablespoon reduced fat cheese and unlimited vegetables.  One slice whole grain toast with 1 teaspoon Smart Beat margarine. Avoid fruit juice since it has too many sugars and too little fiber. Coffee and tea fine with sugar substitute and skim or fat free dairy creamer. Mid morning snack-choosing one of the following will help keep the craving under control. One piece of fruit-apple, pear, banana, 1 cup of grapes One half cup 2% cottage cheese One container of fat free yogurt One low fat mozzarella cheese stick 1 teaspoon natural  peanut butter on celery sticks 1-2 cups air popped popcorn sprayed no more than twice with fat free margarine spray (May only have one per day)   Lunch Start with an all vegetable salad ( no cheese or croutons with 1 teaspoon fat free or light dressing salad dressing. Many patients find it convenient to eat a prepared meal such as [employer], [employer], [employer], [employer].  It is important to alternate these prepared meals with homemade meals due to their high salt content.  See dinner options for homemade meal ideas. May add 1/2 cup rice to meal if prepared meal is not adequate Piece of fruit. Mid Afternoon Snack-Same as mid morning snack Dinner May pick one serving from each of the following categories (derived from [employer]'s best selling book, :"Body for Live"). Start with an all vegetable salad (no cheese or croutons) with 1 teaspoon of fat free or light salad dressing or 1 cup low fat broth based soup Good Proteins (Palm-size portion 4oz) Good Carbohydrates (Fist-sized portion) Good Vegetables  Baked or grilled chicken breast Baked Potato Broccoli  [place] Breast Sweet Potato Asparagus  Lean ground [place] Yam Lettuce  Fish (baked, broiled, grilled, blackened allowed) [employer] ( 1/2-1 cup) Carrots  Tuna (Fresh or canned in water) Wild rice (1/2-1 cup) Cauliflower  Crab Pasta ( 1 cup whole grain) Green Beans  Shrimp Oatmeal Green Peppers  Top round steak Beans(red, pinto, black) Mushrooms   Top  sirloin steak Corn Sp[place]ach  Lean ground beef Strawberries Tomato  Low-fat cottage cheese Whole wheat bread (1 slice regular 2 slice light) Artichoke    Cabbage    Zucchini    Cucumber    Onion   Prepare these foods using low-fat techniques such as grilling, baking, broiling, blackening meats and steaming vegetables. Avoid canned vegetables and fruits to decrease sodium intake.  Fresh and froze produce offer the most nutritional value Evening Snack or "Dessert" 1/2 cup  skim milk with 1 package of No sugar added hot cocoa mix 1 serving of Weight Watchers frozen desserts, Fudgesicle bar, Skinny cow ice cream treats 1 cup Sugar Free Jell-O or instant pudding topped with 1 teaspoon fat free whipped topping   1-2 cups air popped popcorn sprayed no more than twice with Fat Free Margarine Spray (may only have once per day) Water Eight glasses of water daily (lemon, sugar substitute, and crystal light accepted) All regular sodas and sweetened tea are NOT allowed.  This results in taking in too many empty calories.  Two diet sodas per day are allowed as are an unlimited amount of decaffeinated coffee and tea as long as sugar substitute (Splenda, Sweet and Low, Equal) only are used for sweetening. Water naturally suppresses the appetite and helps the body metabolize stored fat Drinking enough water is the best treatment for fluid retention (unless you have been diagnosed with kidney deficiency).  When the body gets less water, it senses it as a threat and holds on to the water.  It is stored in places such as the feet, legs and hands.  By drinking plenty of water, the problem of water, the problem of water retention is solved and weight loss occurs. The above diet modifications are based on the following principles: 25-35 grams of fiber daily (found in vegetables, whole grain, oats, beans and fruits) Choose healthy carbohydrates that are rich in fiber, vitamins, and minerals (wheat bread vs white bread, wheat pasta vs regular pasta) Healthy fats: Choose canola or olive oil if you must use oil to cook with.  Pam cooking spray is a good substitute.  Smart Choice margarine is a healthy alternative to other margarine and butter options.  Baked goods and fried foods should be avoided to decrease calorie intake harmful trans fatty acids and saturated fat absorb in the body. Healthy Proteins: Americans consume far too much red meat (high amount of saturated fat). Healthier sources  of protein are baked or broiled fish,poultry (without skin), beans, low-fat cottage cheese, egg whites and egg substitutes.  If you choose red meat, pick lean cuts such as top round, to sirloin or round, The following will be implemented to help assist you in achieving successful weight loss on a weekly basis: Food Diary: Keeping a record of all food you have eaten on a daily basis helps to make you aware of your food choices and helps determine the amount you have eaten.  It also serves as a great reference to look back to see what you did well on during the weeks you were most successful. Weekly Meal Plan: Plan your meals and go grocery shopping on a weekly basis.  Those patients that plan their meals are far more successful on reaching their goals than other patients.  Without a plan, it is easy to fall back into old eating habits. Weekly weigh in: Patients who report weekly for weight checks do better than those that try to diet without help.  Knowing your weight  will be checked weekly helps keep you on track as well as staying motivated by the encouragement received on a weekly basis.   Exercise  You will need to begin exercising at least 5 days a week.  If you are just beginning an exercise program, start slowly at 15-20 minutes and gradually work up to a longer period of time.  If you are tolerating the exercise well you can increase by 5 minutes each day.  You may use any form of exercise you wish (walking, aerobics, treadmill, stair stepper, elliptical machine).  If you can walk two miles in 30-40 minutes,  You are at a good pace for a successful start.

## 2022-04-16 LAB — COMPLETE METABOLIC PANEL WITH GFR
AG Ratio: 1.3 (calc) (ref 1.0–2.5)
ALT: 14 U/L (ref 6–29)
AST: 16 U/L (ref 10–30)
Albumin: 4 g/dL (ref 3.6–5.1)
Alkaline phosphatase (APISO): 72 U/L (ref 31–125)
BUN/Creatinine Ratio: 20 (calc) (ref 6–22)
BUN: 10 mg/dL (ref 7–25)
CO2: 28 mmol/L (ref 20–32)
Calcium: 9.1 mg/dL (ref 8.6–10.2)
Chloride: 101 mmol/L (ref 98–110)
Creat: 0.49 mg/dL — ABNORMAL LOW (ref 0.50–0.97)
Globulin: 3 g/dL (calc) (ref 1.9–3.7)
Glucose, Bld: 74 mg/dL (ref 65–99)
Potassium: 4.4 mmol/L (ref 3.5–5.3)
Sodium: 138 mmol/L (ref 135–146)
Total Bilirubin: 0.9 mg/dL (ref 0.2–1.2)
Total Protein: 7 g/dL (ref 6.1–8.1)
eGFR: 127 mL/min/{1.73_m2} (ref >=60)

## 2022-04-16 LAB — LIPID PANEL
Cholesterol: 189 mg/dL (ref <200)
HDL: 39 mg/dL — ABNORMAL LOW (ref >=50)
LDL Cholesterol (Calc): 131 mg/dL (calc) — ABNORMAL HIGH
Non-HDL Cholesterol (Calc): 150 mg/dL (calc) — ABNORMAL HIGH (ref <130)
Total CHOL/HDL Ratio: 4.8 (calc) (ref <5.0)
Triglycerides: 89 mg/dL (ref <150)

## 2022-04-16 LAB — CBC WITH DIFFERENTIAL/PLATELET
Absolute Monocytes: 462 cells/uL (ref 200–950)
Basophils Absolute: 11 cells/uL (ref 0–200)
Basophils Relative: 0.1 %
Eosinophils Absolute: 165 cells/uL (ref 15–500)
Eosinophils Relative: 1.5 %
HCT: 42.9 % (ref 35.0–45.0)
Hemoglobin: 14.8 g/dL (ref 11.7–15.5)
Lymphs Abs: 2838 cells/uL (ref 850–3900)
MCH: 31.5 pg (ref 27.0–33.0)
MCHC: 34.5 g/dL (ref 32.0–36.0)
MCV: 91.3 fL (ref 80.0–100.0)
MPV: 11.4 fL (ref 7.5–12.5)
Monocytes Relative: 4.2 %
Neutro Abs: 7524 cells/uL (ref 1500–7800)
Neutrophils Relative %: 68.4 %
Platelets: 274 10*3/uL (ref 140–400)
RBC: 4.7 10*6/uL (ref 3.80–5.10)
RDW: 12.2 % (ref 11.0–15.0)
Total Lymphocyte: 25.8 %
WBC: 11 10*3/uL — ABNORMAL HIGH (ref 3.8–10.8)

## 2022-04-16 LAB — VITAMIN D 25 HYDROXY (VIT D DEFICIENCY, FRACTURES): Vit D, 25-Hydroxy: 18 ng/mL — ABNORMAL LOW (ref 30–100)

## 2022-05-25 ENCOUNTER — Encounter: Payer: Self-pay | Admitting: Obstetrics & Gynecology

## 2022-05-25 ENCOUNTER — Ambulatory Visit: Payer: 59 | Admitting: Obstetrics & Gynecology

## 2022-05-25 VITALS — BP 116/72 | HR 91

## 2022-05-25 DIAGNOSIS — Z3046 Encounter for surveillance of implantable subdermal contraceptive: Secondary | ICD-10-CM

## 2022-05-25 DIAGNOSIS — Z30011 Encounter for initial prescription of contraceptive pills: Secondary | ICD-10-CM | POA: Diagnosis not present

## 2022-05-25 MED ORDER — NORETHIN ACE-ETH ESTRAD-FE 1-20 MG-MCG(24) PO TABS: 1.0000 | ORAL_TABLET | Freq: Every day | ORAL | 5 refills | Status: DC

## 2022-05-25 NOTE — Progress Notes (Signed)
Samantha Nicholson 1986/04/27 [id number]        37 y.o.  G2P1102   RP: Nexplanon removal/Counseling on contraception  HPI: Nexplanon x 10/2015.  Abstinent x 1 year.  Menses regular normal every month.  LMP 04/22/22.  No pelvic pain.  Would like to be on continuous BCPs for menstrual migraines.  No Aura/Neuro Sxs with migraines.  No CI to BCPs.  Not a cigarette smoker.   OB History  Gravida Para Term Preterm AB Living  2 2 1 1   2   SAB IAB Ectopic Multiple Live Births          2    # Outcome Date GA Lbr Len/2nd Weight Sex Delivery Anes PTL Lv  2 Preterm           1 Term             Past medical history,surgical history, problem list, medications, allergies, family history and social history were all reviewed and documented in the EPIC chart.   Directed ROS with pertinent positives and negatives documented in the history of present illness/assessment and plan.  Exam:  Vitals:   05/25/22 1149  BP: 116/72  Pulse: 91  SpO2: 99%   General appearance:  Normal                                                             Nexplanon procedure note (removal)  The patient presented to the office today requesting for removal of her Nexplanon that was placed in the year 2017 on her left arm.   On examination the nexplanon implant was palpated and the distal end  (end  closest to the elbow) was marked. The area was sterilized with Betadine solution. 1% lidocaine was used for local anesthesia and approximately 1 cc  was injected into the site that was marked where the incision was to be made. The local anesthetic was injected under the implant in an effort to keep it  close to the skin surface. Slight pressure pushing downward was made at the proximal end  of the implant in an effort to stabilize it. A bulge appeared indicating the distal end of the implant. A small transverse incision of 2 mm was made at that location. By gently pushing the implant toward the incision, the tip became visible.  Grasping the implant with a curved forcep facilitated in gently removing the implant. Full confirmation of the entire implant which is 4 cm long was inspected and was intact and was shown to the patient and discarded. After removing the implant, the incision was closed with 3 Steri-Strips, a band-aid and a bandage. Patient will be instructed to remove the pressure bandage in 24 hours, the band-aid in 3 days and the Steri-Strips in 7 days.   Assessment/Plan:  37 y.o. G2P1102   1. Nexplanon removal Nexplanon x 10/2015.  Abstinent x 1 year.  Menses regular normal every month.  LMP 04/22/22.  No pelvic pain.  Would like to be on continuous BCPs for menstrual migraines.  No Aura/Neuro Sxs with migraines.  No CI to BCPs.  Not a cigarette smoker.  Easy removal of Nexplanon.  Well tolerated, no Cx.  Post procedure precautions reviewed.    2. Encounter for initial prescription of contraceptive pills Counseling on  contraception that could help with menstrual migraines with no Aura/Neuro Sxs.  Decision to start on low dose continuous BCPs.  Usage reviewed, prescription sent to pharmacy.  Other orders - Norethindrone Acetate-Ethinyl Estrad-FE (LOESTRIN 24 FE) 1-20 MG-MCG(24) tablet; Take 1 tablet by mouth daily. Continuous use for menstrual migraines with no neuro symptoms.   Princess Bruins MD, 11:54 AM 05/25/2022

## 2022-06-08 ENCOUNTER — Encounter: Payer: Self-pay | Admitting: Obstetrics & Gynecology

## 2022-06-08 DIAGNOSIS — Z30011 Encounter for initial prescription of contraceptive pills: Secondary | ICD-10-CM

## 2022-06-17 MED ORDER — NORETHIN-ETH ESTRAD-FE BIPHAS 1 MG-10 MCG / 10 MCG PO TABS: 1.0000 | ORAL_TABLET | Freq: Every day | ORAL | 0 refills | Status: DC

## 2022-06-17 NOTE — Telephone Encounter (Signed)
FYI. Pt agreed. Rx sent.  Will send to provider for final review and close encounter.

## 2022-07-27 ENCOUNTER — Encounter: Payer: Self-pay | Admitting: Obstetrics & Gynecology

## 2022-08-08 ENCOUNTER — Telehealth: Payer: 59 | Admitting: Physician Assistant

## 2022-08-08 DIAGNOSIS — J019 Acute sinusitis, unspecified: Secondary | ICD-10-CM

## 2022-08-08 DIAGNOSIS — B9689 Other specified bacterial agents as the cause of diseases classified elsewhere: Secondary | ICD-10-CM

## 2022-08-08 MED ORDER — FLUTICASONE PROPIONATE 50 MCG/ACT NA SUSP: 2.0000 | Freq: Every day | NASAL | 0 refills | Status: AC

## 2022-08-08 MED ORDER — BENZONATATE 100 MG PO CAPS: 100.0000 mg | ORAL_CAPSULE | Freq: Three times a day (TID) | ORAL | 0 refills | Status: DC | PRN

## 2022-08-08 MED ORDER — AMOXICILLIN-POT CLAVULANATE 875-125 MG PO TABS: 1.0000 | ORAL_TABLET | Freq: Two times a day (BID) | ORAL | 0 refills | Status: DC

## 2022-08-08 NOTE — Progress Notes (Signed)
Virtual Visit Consent   Samantha Nicholson, you are scheduled for a virtual visit with a [facility] provider today. Just as with appointments in the office, your consent must be obtained to participate. Your consent will be active for this visit and any virtual visit you may have with one of our providers in the next 365 days. If you have a MyChart account, a copy of this consent can be sent to you electronically.  As this is a virtual visit, video technology does not allow for your provider to perform a traditional examination. This may limit your provider's ability to fully assess your condition. If your provider identifies any concerns that need to be evaluated in person or the need to arrange testing (such as labs, EKG, etc.), we will make arrangements to do so. Although advances in technology are sophisticated, we cannot ensure that it will always work on either your end or our end. If the connection with a video visit is poor, the visit may have to be switched to a telephone visit. With either a video or telephone visit, we are not always able to ensure that we have a secure connection.  By engaging in this virtual visit, you consent to the provision of healthcare and authorize for your insurance to be billed (if applicable) for the services provided during this visit. Depending on your insurance coverage, you may receive a charge related to this service.  I need to obtain your verbal consent now. Are you willing to proceed with your visit today? AMINA MAGNUSON has provided verbal consent on 08/08/2022 for a virtual visit (video or telephone). Samantha Nicholson, [place]  Date: 08/08/2022 10:01 AM  Virtual Visit via Video Note   I, Samantha Nicholson, connected with  ROSSELLA SOUTH  ([id number], 05/06/1985) on 08/08/22 at 10:00 AM EDT by a video-enabled telemedicine application and verified that I am speaking with the correct person using two identifiers.  Location: Patient: Virtual Visit Location  Patient: Home Provider: Virtual Visit Location Provider: Home Office   I discussed the limitations of evaluation and management by telemedicine and the availability of in person appointments. The patient expressed understanding and agreed to proceed.    History of Present Illness: Samantha Nicholson is a 37 y.o. who identifies as a female who was assigned female at birth, and is being seen today for almost 2 weeks of a cough that she initially thgouth was related to allergies. This has persistent and now productive along with increased head congestion. No with sinus pressure/sinus pain and R ear pain.  OTC -- Dayquil, Advil Cold and Flu, OTc allergy medications (Allegra)  HPI: HPI  Problems:  Patient Active Problem List   Diagnosis Date Noted   Hyperlipidemia, mixed 04/13/2022   Obesity (BMI 30.0-34.9) 04/13/2022   Anxiety 12/03/2021   Asthma 12/03/2021   Migraine headache 12/03/2021   Scoliosis 12/03/2021   Right otitis media 11/08/2016   Otalgia of right ear 11/08/2016   Influenza with respiratory manifestation other than pneumonia 07/13/2016   Fever 07/13/2016   Myalgia 07/13/2016   Acute nonintractable headache 07/13/2016    Allergies: No Known Allergies Medications:  Current Outpatient Medications:    amoxicillin-clavulanate (AUGMENTIN) 875-125 MG tablet, Take 1 tablet by mouth 2 (two) times daily., Disp: 14 tablet, Rfl: 0   benzonatate (TESSALON) 100 MG capsule, Take 1 capsule (100 mg total) by mouth 3 (three) times daily as needed for cough., Disp: 30 capsule, Rfl: 0   fluticasone (FLONASE) 50  MCG/ACT nasal spray, Place 2 sprays into both nostrils daily., Disp: 16 g, Rfl: 0   albuterol (VENTOLIN HFA) 108 (90 Base) MCG/ACT inhaler, Inhale 2 puffs into the lungs every 6 (six) hours as needed for wheezing or shortness of breath., Disp: 8 g, Rfl: 2   baclofen (LIORESAL) 10 MG tablet, Take 1 tablet (10 mg total) by mouth 2 (two) times daily. Take 1/2 to 1 tablet 2 x day if needed for  muscle spasm, Disp: 60 tablet, Rfl: 1   Cholecalciferol (VITAMIN D-3 PO), Take by mouth., Disp: , Rfl:    FLUoxetine (PROZAC) 10 MG capsule, Take 1 capsule (10 mg total) by mouth daily., Disp: 30 capsule, Rfl: 2   Norethindrone-Ethinyl Estradiol-Fe Biphas (LO LOESTRIN FE) 1 MG-10 MCG / 10 MCG tablet, Take 1 tablet by mouth daily., Disp: 84 tablet, Rfl: 0  Observations/Objective: Patient is well-developed, well-nourished in no acute distress.  Resting comfortably at home.  Head is normocephalic, atraumatic.  No labored breathing.  Speech is clear and coherent with logical content.  Patient is alert and oriented at baseline.   Assessment and Plan: 1. Acute bacterial sinusitis - amoxicillin-clavulanate (AUGMENTIN) 875-125 MG tablet; Take 1 tablet by mouth 2 (two) times daily.  Dispense: 14 tablet; Refill: 0 - fluticasone (FLONASE) 50 MCG/ACT nasal spray; Place 2 sprays into both nostrils daily.  Dispense: 16 g; Refill: 0  Rx Augmentin.  Increase fluids.  Rest.  Saline nasal spray.  Probiotic.  Mucinex as directed.  Humidifier in bedroom. Flonase per orders.  Call or return to clinic if symptoms are not improving.   Follow Up Instructions: I discussed the assessment and treatment plan with the patient. The patient was provided an opportunity to ask questions and all were answered. The patient agreed with the plan and demonstrated an understanding of the instructions.  A copy of instructions were sent to the patient via MyChart unless otherwise noted below.   The patient was advised to call back or seek an in-person evaluation if the symptoms worsen or if the condition fails to improve as anticipated.  Time:  I spent 10 minutes with the patient via telehealth technology discussing the above problems/concerns.    Samantha Climes, PA-C

## 2022-08-08 NOTE — Patient Instructions (Signed)
[name], thank you for joining Piedad Climes, PA-C for today's virtual visit.  While this provider is not your primary care provider (PCP), if your PCP is located in our provider database this encounter information will be shared with them immediately following your visit.   A [facility] MyChart account gives you access to today's visit and all your visits, tests, and labs performed at [facility] " click here if you don't have a [facility] MyChart account or go to [email or web address]  Consent: (Patient) Samantha Nicholson provided verbal consent for this virtual visit at the beginning of the encounter.  Current Medications:  Current Outpatient Medications:    albuterol (VENTOLIN HFA) 108 (90 Base) MCG/ACT inhaler, Inhale 2 puffs into the lungs every 6 (six) hours as needed for wheezing or shortness of breath., Disp: 8 g, Rfl: 2   baclofen (LIORESAL) 10 MG tablet, Take 1 tablet (10 mg total) by mouth 2 (two) times daily. Take 1/2 to 1 tablet 2 x day if needed for muscle spasm, Disp: 60 tablet, Rfl: 1   Cetirizine HCl 10 MG CAPS, Take 1 capsule (10 mg total) by mouth daily for 10 days., Disp: 10 capsule, Rfl: 0   Cholecalciferol (VITAMIN D-3 PO), Take by mouth., Disp: , Rfl:    FLUoxetine (PROZAC) 10 MG capsule, Take 1 capsule (10 mg total) by mouth daily., Disp: 30 capsule, Rfl: 2   Norethindrone-Ethinyl Estradiol-Fe Biphas (LO LOESTRIN FE) 1 MG-10 MCG / 10 MCG tablet, Take 1 tablet by mouth daily., Disp: 84 tablet, Rfl: 0   Medications ordered in this encounter:  No orders of the defined types were placed in this encounter.    *If you need refills on other medications prior to your next appointment, please contact your pharmacy*  Follow-Up: Call back or seek an in-person evaluation if the symptoms worsen or if the condition fails to improve as anticipated.  [facility] [phone]  Other Instructions Please take antibiotic as directed.   Increase fluid intake.  Use Saline nasal spray.  Take a daily multivitamin. Use the Flonase as directed.  Place a humidifier in the bedroom.  Please call or return clinic if symptoms are not improving.  Sinusitis Sinusitis is redness, soreness, and swelling (inflammation) of the paranasal sinuses. Paranasal sinuses are air pockets within the bones of your face (beneath the eyes, the middle of the forehead, or above the eyes). In healthy paranasal sinuses, mucus is able to drain out, and air is able to circulate through them by way of your nose. However, when your paranasal sinuses are inflamed, mucus and air can become trapped. This can allow bacteria and other germs to grow and cause infection. Sinusitis can develop quickly and last only a short time (acute) or continue over a long period (chronic). Sinusitis that lasts for more than 12 weeks is considered chronic.  CAUSES  Causes of sinusitis include: Allergies. Structural abnormalities, such as displacement of the cartilage that separates your nostrils (deviated septum), which can decrease the air flow through your nose and sinuses and affect sinus drainage. Functional abnormalities, such as when the small hairs (cilia) that line your sinuses and help remove mucus do not work properly or are not present. SYMPTOMS  Symptoms of acute and chronic sinusitis are the same. The primary symptoms are pain and pressure around the affected sinuses. Other symptoms include: Upper toothache. Earache. Headache. Bad breath. Decreased sense of smell and taste. A cough, which worsens when you are  lying flat. Fatigue. Fever. Thick drainage from your nose, which often is green and may contain pus (purulent). Swelling and warmth over the affected sinuses. DIAGNOSIS  Your caregiver will perform a physical exam. During the exam, your caregiver may: Look in your nose for signs of abnormal growths in your nostrils (nasal polyps). Tap over the affected sinus to  check for signs of infection. View the inside of your sinuses (endoscopy) with a special imaging device with a light attached (endoscope), which is inserted into your sinuses. If your caregiver suspects that you have chronic sinusitis, one or more of the following tests may be recommended: Allergy tests. Nasal culture A sample of mucus is taken from your nose and sent to a lab and screened for bacteria. Nasal cytology A sample of mucus is taken from your nose and examined by your caregiver to determine if your sinusitis is related to an allergy. TREATMENT  Most cases of acute sinusitis are related to a viral infection and will resolve on their own within 10 days. Sometimes medicines are prescribed to help relieve symptoms (pain medicine, decongestants, nasal steroid sprays, or saline sprays).  However, for sinusitis related to a bacterial infection, your caregiver will prescribe antibiotic medicines. These are medicines that will help kill the bacteria causing the infection.  Rarely, sinusitis is caused by a fungal infection. In theses cases, your caregiver will prescribe antifungal medicine. For some cases of chronic sinusitis, surgery is needed. Generally, these are cases in which sinusitis recurs more than 3 times per year, despite other treatments. HOME CARE INSTRUCTIONS  Drink plenty of water. Water helps thin the mucus so your sinuses can drain more easily. Use a humidifier. Inhale steam 3 to 4 times a day (for example, sit in the bathroom with the shower running). Apply a warm, moist washcloth to your face 3 to 4 times a day, or as directed by your caregiver. Use saline nasal sprays to help moisten and clean your sinuses. Take over-the-counter or prescription medicines for pain, discomfort, or fever only as directed by your caregiver. SEEK IMMEDIATE MEDICAL CARE IF: You have increasing pain or severe headaches. You have nausea, vomiting, or drowsiness. You have swelling around your  face. You have vision problems. You have a stiff neck. You have difficulty breathing. MAKE SURE YOU:  Understand these instructions. Will watch your condition. Will get help right away if you are not doing well or get worse. Document Released: 04/18/2005 Document Revised: 07/11/2011 Document Reviewed: 05/03/2011 [facility] Patient Information 2014 [place], [place].    If you have been instructed to have an in-person evaluation today at a local Urgent Care facility, please use the link below. It will take you to a list of all of our available [facility], including address, phone number and hours of operation. Please do not delay care.  [facility]  If you or a family member do not have a primary care provider, use the link below to schedule a visit and establish care. When you choose a [facility] primary care physician or advanced practice provider, you gain a long-term partner in health. Find a Primary Care Provider  Learn more about [facility]'s in-office and virtual care options:  - Get Care Now

## 2022-08-16 ENCOUNTER — Ambulatory Visit: Payer: 59 | Admitting: Obstetrics & Gynecology

## 2022-08-19 ENCOUNTER — Encounter: Payer: Self-pay | Admitting: Obstetrics & Gynecology

## 2022-08-19 DIAGNOSIS — Z30011 Encounter for initial prescription of contraceptive pills: Secondary | ICD-10-CM

## 2022-08-19 NOTE — Telephone Encounter (Signed)
Last seen 05/25/2022--AEX scheduled for 10/26/2022.  States she will need one more refill to get her through until visit. Rx pend.

## 2022-08-22 MED ORDER — NORETHIN-ETH ESTRAD-FE BIPHAS 1 MG-10 MCG / 10 MCG PO TABS: 1.0000 | ORAL_TABLET | Freq: Every day | ORAL | 0 refills | Status: DC

## 2022-10-19 ENCOUNTER — Ambulatory Visit: Payer: BC Managed Care – PPO | Admitting: Nurse Practitioner

## 2022-10-26 ENCOUNTER — Other Ambulatory Visit (HOSPITAL_COMMUNITY)
Admission: RE | Admit: 2022-10-26 | Discharge: 2022-10-26 | Disposition: A | Discharge status: Home / Self Care | Payer: BC Managed Care – PPO | Source: Ambulatory Visit | Attending: Obstetrics & Gynecology | Admitting: Obstetrics & Gynecology

## 2022-10-26 ENCOUNTER — Ambulatory Visit (INDEPENDENT_AMBULATORY_CARE_PROVIDER_SITE_OTHER): Payer: BC Managed Care – PPO | Admitting: Obstetrics & Gynecology

## 2022-10-26 ENCOUNTER — Encounter: Payer: Self-pay | Admitting: Obstetrics & Gynecology

## 2022-10-26 VITALS — BP 124/82 | HR 89 | Ht 62.5 in | Wt 208.0 lb

## 2022-10-26 DIAGNOSIS — Z01419 Encounter for gynecological examination (general) (routine) without abnormal findings: Secondary | ICD-10-CM

## 2022-10-26 DIAGNOSIS — Z3041 Encounter for surveillance of contraceptive pills: Secondary | ICD-10-CM

## 2022-10-26 MED ORDER — NORETHIN-ETH ESTRAD-FE BIPHAS 1 MG-10 MCG / 10 MCG PO TABS: 1.0000 | ORAL_TABLET | Freq: Every day | ORAL | 4 refills | Status: DC

## 2022-10-26 NOTE — Progress Notes (Signed)
Samantha Nicholson May 29, 1985 [id number]   History:    37 y.o. G2P2L2 Divorced.  Son with Autism.  Daughter well.  Mother deceased from Endometrial Ca in 08/2022.  RP:  Established patient presenting for annual gyn exam   HPI: Well on LoLoEstrin Fe.  Occasional light BTB.  No pelvic pain.  Abstinent x many years.  No h/o abnormal Pap, doesn't remember when her last Pap was.  Pap reflex today.  Breasts normal.  BMI 37.44.  Urine/BMs normal.  Normal grieving.  No suicidal ideations.  Psychotherapy online every 2 weeks.  Health labs with Fam MD.  Past medical history,surgical history, family history and social history were all reviewed and documented in the EPIC chart.  Gynecologic History Patient's last menstrual period was 08/31/2022.  Obstetric History OB History  Gravida Para Term Preterm AB Living  2 2 1 1   2   SAB IAB Ectopic Multiple Live Births          2    # Outcome Date GA Lbr Len/2nd Weight Sex Delivery Anes PTL Lv  2 Preterm           1 Term              ROS: A ROS was performed and pertinent positives and negatives are included in the history. GENERAL: No fevers or chills. HEENT: No change in vision, no earache, sore throat or sinus congestion. NECK: No pain or stiffness. CARDIOVASCULAR: No chest pain or pressure. No palpitations. PULMONARY: No shortness of breath, cough or wheeze. GASTROINTESTINAL: No abdominal pain, nausea, vomiting or diarrhea, melena or bright red blood per rectum. GENITOURINARY: No urinary frequency, urgency, hesitancy or dysuria. MUSCULOSKELETAL: No joint or muscle pain, no back pain, no recent trauma. DERMATOLOGIC: No rash, no itching, no lesions. ENDOCRINE: No polyuria, polydipsia, no heat or cold intolerance. No recent change in weight. HEMATOLOGICAL: No anemia or easy bruising or bleeding. NEUROLOGIC: No headache, seizures, numbness, tingling or weakness. PSYCHIATRIC: No depression, no loss of interest in normal activity or change in sleep pattern.      Exam:   BP 124/82 (BP Location: Right Arm, Patient Position: Sitting, Cuff Size: Large)   Pulse 89   Ht 5' 2.5" (1.588 m)   Wt 208 lb (94.3 kg)   LMP 08/31/2022   SpO2 95%   BMI 37.44 kg/m   Body mass index is 37.44 kg/m.  General appearance : Well developed well nourished female. No acute distress HEENT: Eyes: no retinal hemorrhage or exudates,  Neck supple, trachea midline, no carotid bruits, no thyroidmegaly Lungs: Clear to auscultation, no rhonchi or wheezes, or rib retractions  Heart: Regular rate and rhythm, no murmurs or gallops Breast:Examined in sitting and supine position were symmetrical in appearance, no palpable masses or tenderness,  no skin retraction, no nipple inversion, no nipple discharge, no skin discoloration, no axillary or supraclavicular lymphadenopathy Abdomen: no palpable masses or tenderness, no rebound or guarding Extremities: no edema or skin discoloration or tenderness  Pelvic: Vulva: Normal             Vagina: No gross lesions or discharge  Cervix: No gross lesions or discharge.  Pap reflex done.  Uterus  AV, normal size, shape and consistency, non-tender and mobile  Adnexa  Without masses or tenderness  Anus: Normal   Assessment/Plan:  37 y.o. female for annual exam   1. Encounter for routine gynecological examination with Papanicolaou smear of cervix Well on LoLoEstrin Fe.  Occasional light BTB.  No pelvic pain.  Abstinent x many years.  No h/o abnormal Pap, doesn't remember when her last Pap was.  Pap reflex today.  Breasts normal.  BMI 37.44.  Urine/BMs normal.  Normal grieving.  No suicidal ideations.  Psychotherapy online every 2 weeks.  Health labs with Fam MD. - Cytology - PAP( [facility])  2. Encounter for surveillance of contraceptive pills Well on LoLoEstrin Fe.  Occasional light BTB.  No pelvic pain.  Abstinent x many years.  No CI to continue on BCPs, prescription sent to pharmacy. - Norethindrone-Ethinyl Estradiol-Fe Biphas (LO  LOESTRIN FE) 1 MG-10 MCG / 10 MCG tablet; Take 1 tablet by mouth daily.   Genia Del MD, 11:50 AM

## 2022-10-28 LAB — CYTOLOGY - PAP: Diagnosis: NEGATIVE

## 2022-11-22 ENCOUNTER — Ambulatory Visit: Payer: BC Managed Care – PPO | Admitting: Nurse Practitioner

## 2022-11-22 NOTE — Progress Notes (Signed)
FOLLOW UP  Assessment and Plan:   Iron deficiency Anemia Continue to monitor CBC with anemia panel Discussed iron rich foods Continue to monitor  Hyperlipidemia Discussed lifestyle modifications. Recommended diet heavy in fruits and veggies, omega 3's. Decrease consumption of animal meats, cheeses, and dairy products. Remain active and exercise as tolerated. Continue to monitor. Check lipids/TSH   Anxiety No longer taking Prozac Switch to Buspar Reviewed relaxation techniques.  Sleep hygiene. Continue Cognitive Behavioral Therapy (CBT) vit telehealth Recommended mindfulness meditation and exercise.   Encouraged personality growth wand development through coping techniques and problem-solving skills. Limit/Decrease/Monitor drug/alcohol intake.    Obesity with co morbidities Trending down  Discussed appropriate BMI Diet modification. Physical activity. Encouraged/praised to build confidence.  Vitamin D deficiency Continue supplement for goal of 60-100 Monitor Vitamin D levels  Medication Management All medications discussed and reviewed in full. All questions and concerns regarding medications addressed.    Nexplanon in place Removed - continue Lo Loestrin   Carpel Tunnel Syndrome Suggest Brace Start Meloxicam PRN Rest when flared Continue to monitor  Orders Placed This Encounter  Procedures   CBC with Differential/Platelet   COMPLETE METABOLIC PANEL WITH GFR   Lipid panel   VITAMIN D 25 Hydroxy (Vit-D Deficiency, Fractures)   Meds ordered this encounter  Medications   busPIRone (BUSPAR) 5 MG tablet    Sig: Take     1/2 to 1 tablet     3 x /day      for Anxiety    Dispense:  90 tablet    Refill:  1    Order Specific Question:   Supervising Provider    Answer:   Lucky Cowboy [[id number]]   meloxicam (MOBIC) 7.5 MG tablet    Sig: Take 1 tablet (7.5 mg total) by mouth daily.    Dispense:  30 tablet    Refill:  1    Order Specific Question:   Supervising  Provider    Answer:   Lucky Cowboy [phone]   Notify office for further evaluation and treatment, questions or concerns if any reported s/s fail to improve.   The patient was advised to call back or seek an in-person evaluation if any symptoms worsen or if the condition fails to improve as anticipated.   Further disposition pending results of labs. Discussed med's effects and SE's.    I discussed the assessment and treatment plan with the patient. The patient was provided an opportunity to ask questions and all were answered. The patient agreed with the plan and demonstrated an understanding of the instructions.  Discussed med's effects and SE's. Screening labs and tests as requested with regular follow-up as recommended.  I provided 20 minutes of face-to-face time during this encounter including counseling, chart review, and critical decision making was preformed.  Today's Plan of Care is based on a patient-centered health care approach known as shared decision making - the decisions, tests and treatments allow for patient preferences and values to be balanced with clinical evidence.     No future appointments.    ----------------------------------------------------------------------------------------------------------------------  HPI 37 y.o. female  presents for a follow up on hypertension, cholesterol, diabetes, weight and vitamin D deficiency.  She states that for the past three weeks she has noticed pain in bilateral hands that radiates up the forearms and down to the finger tips.  Accompanied by numbness and tingling.  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  She has done minimal bracing.  Takes IBU 800 mg PRN which is not effective.  Denies injury, fall, trauma.    She has Nexplanon that has been in place x 6 years and was placed at [facility].  She wanted removed and was referred to GYN.  Now removed and taking oral BC Lo Estrin Fe.    She was  prescribed Prozac 10 mg daily but stopped taking b/c it made her feel on edge.  She continues to suffer from anxiety.  Lost her mother and grandmother over the past year.   She is having increased stress.    BMI is Body mass index is 34.15 kg/m., she has  been working on diet and exercise. No longer drinking soda.  Wt Readings from Last 3 Encounters:  11/23/22 205 lb 3.2 oz (93.1 kg)  10/26/22 208 lb (94.3 kg)  04/15/22 214 lb 3.2 oz (97.2 kg)    Her blood pressure has been controlled at home, today their BP is BP: 112/82  BP Readings from Last 3 Encounters:  11/23/22 112/82  10/26/22 124/82  05/25/22 116/72   She does not workout. She denies chest pain, shortness of breath, dizziness.   She is not on cholesterol medication.Her cholesterol is not at goal. The cholesterol last visit was:   Lab Results  Component Value Date   CHOL 189 04/15/2022   HDL 39 (L) 04/15/2022   LDLCALC 131 (H) 04/15/2022   TRIG 89 04/15/2022   CHOLHDL 4.8 04/15/2022    She has not been working on diet and exercise for abnormal glucose. Last A1C in the office was:  Lab Results  Component Value Date   HGBA1C 5.2 12/03/2021   Patient is on Vitamin D supplement.   Lab Results  Component Value Date   VD25OH 18 (L) 04/15/2022        Current Medications:  Current Outpatient Medications on File Prior to Visit  Medication Sig   albuterol (VENTOLIN HFA) 108 (90 Base) MCG/ACT inhaler Inhale 2 puffs into the lungs every 6 (six) hours as needed for wheezing or shortness of breath.   baclofen (LIORESAL) 10 MG tablet Take 1 tablet (10 mg total) by mouth 2 (two) times daily. Take 1/2 to 1 tablet 2 x day if needed for muscle spasm   Cholecalciferol (VITAMIN D-3 PO) Take by mouth.   fluticasone (FLONASE) 50 MCG/ACT nasal spray Place 2 sprays into both nostrils daily.   Norethindrone-Ethinyl Estradiol-Fe Biphas (LO LOESTRIN FE) 1 MG-10 MCG / 10 MCG tablet Take 1 tablet by mouth daily.   No current  facility-administered medications on file prior to visit.     Allergies: No Known Allergies   Medical History:  Past Medical History:  Diagnosis Date   Anxiety    Asthma    Migraine headache    Scoliosis    Family history- Reviewed and unchanged Social history- Reviewed and unchanged   Review of Systems:  Review of Systems  Constitutional:  Negative for chills, fever and weight loss.  HENT:  Negative for congestion and hearing loss.   Eyes:  Negative for blurred vision and double vision.  Respiratory:  Negative for cough and shortness of breath.   Cardiovascular:  Negative for chest pain, palpitations, orthopnea and leg swelling.  Gastrointestinal:  Negative for abdominal pain, constipation, diarrhea, heartburn, nausea and vomiting.  Musculoskeletal:  Negative for falls, joint pain and myalgias.  Skin:  Negative for rash.  Neurological:  Negative for dizziness, tingling, tremors, loss of consciousness and headaches.  Psychiatric/Behavioral:  Negative  for depression, memory loss and suicidal ideas.       Physical Exam: BP 112/82   Pulse 89   Temp (!) 97.5 F (36.4 C)   Ht 5\' 5"  (1.651 m)   Wt 205 lb 3.2 oz (93.1 kg)   LMP 08/31/2022   SpO2 98%   BMI 34.15 kg/m  Wt Readings from Last 3 Encounters:  11/23/22 205 lb 3.2 oz (93.1 kg)  10/26/22 208 lb (94.3 kg)  04/15/22 214 lb 3.2 oz (97.2 kg)   General Appearance: Obese pleasant female, in no apparent distress. Eyes: PERRLA, EOMs, conjunctiva no swelling or erythema Sinuses: No Frontal/maxillary tenderness ENT/Mouth: Ext aud canals clear, TMs without erythema, bulging. No erythema, swelling, or exudate on post pharynx Missing all teeth Neck: Supple, thyroid normal.  Respiratory: Respiratory effort normal, BS equal bilaterally without rales, rhonchi, wheezing or stridor.  Cardio: RRR with no MRGs. Brisk peripheral pulses without edema.  Abdomen: Soft, + BS.  Non tender, no guarding, rebound, hernias,  masses. Lymphatics: Non tender without lymphadenopathy.  Musculoskeletal: Full ROM, 5/5 strength, Normal gait Skin: Warm, dry without rashes, lesions, ecchymosis.  Neuro: Cranial nerves intact. No cerebellar symptoms.  Psych: Awake and oriented X 3, normal affect, Insight and Judgment appropriate.    Adela Glimpse, NP 10:25 AM [facility]

## 2022-11-23 ENCOUNTER — Encounter: Payer: Self-pay | Admitting: Nurse Practitioner

## 2022-11-23 ENCOUNTER — Ambulatory Visit (INDEPENDENT_AMBULATORY_CARE_PROVIDER_SITE_OTHER): Payer: BC Managed Care – PPO | Admitting: Nurse Practitioner

## 2022-11-23 VITALS — BP 112/82 | HR 89 | Temp 97.5°F | Ht 65.0 in | Wt 205.2 lb

## 2022-11-23 DIAGNOSIS — E782 Mixed hyperlipidemia: Secondary | ICD-10-CM

## 2022-11-23 DIAGNOSIS — E669 Obesity, unspecified: Secondary | ICD-10-CM

## 2022-11-23 DIAGNOSIS — F419 Anxiety disorder, unspecified: Secondary | ICD-10-CM | POA: Diagnosis not present

## 2022-11-23 DIAGNOSIS — E559 Vitamin D deficiency, unspecified: Secondary | ICD-10-CM

## 2022-11-23 DIAGNOSIS — G5603 Carpal tunnel syndrome, bilateral upper limbs: Secondary | ICD-10-CM

## 2022-11-23 DIAGNOSIS — Z79899 Other long term (current) drug therapy: Secondary | ICD-10-CM

## 2022-11-23 DIAGNOSIS — D509 Iron deficiency anemia, unspecified: Secondary | ICD-10-CM | POA: Diagnosis not present

## 2022-11-23 LAB — CBC WITH DIFFERENTIAL/PLATELET
Absolute Monocytes: 738 cells/uL (ref 200–950)
Basophils Absolute: 24 cells/uL (ref 0–200)
Basophils Relative: 0.2 %
Eosinophils Absolute: 48 cells/uL (ref 15–500)
HCT: 41.6 % (ref 35.0–45.0)
MCHC: 34.1 g/dL (ref 32.0–36.0)
MPV: 11.8 fL (ref 7.5–12.5)
Monocytes Relative: 6.2 %
Neutrophils Relative %: 78.3 %
Total Lymphocyte: 14.9 %

## 2022-11-23 MED ORDER — MELOXICAM 7.5 MG PO TABS
7.5000 mg | ORAL_TABLET | Freq: Every day | ORAL | 1 refills | Status: AC
Start: 2022-11-23 — End: ?

## 2022-11-23 MED ORDER — BUSPIRONE HCL 5 MG PO TABS
ORAL_TABLET | ORAL | 1 refills | Status: AC
Start: 2022-11-23 — End: ?

## 2022-11-23 NOTE — Patient Instructions (Signed)
Buspirone Tablets What is this medication? BUSPIRONE (byoo SPYE rone) treats anxiety. It works by balancing the levels of dopamine and serotonin in your brain, substances that help regulate mood. This medicine may be used for other purposes; ask your health care provider or pharmacist if you have questions. COMMON BRAND NAME(S): BuSpar, Buspar Dividose What should I tell my care team before I take this medication? They need to know if you have any of these conditions: Kidney or liver disease An unusual or allergic reaction to buspirone, other medications, foods, dyes, or preservatives Pregnant or trying to get pregnant Breast-feeding How should I use this medication? Take this medication by mouth with a glass of water. Follow the directions on the prescription label. You may take this medication with or without food. To ensure that this medication always works the same way for you, you should take it either always with or always without food. Take your doses at regular intervals. Do not take your medication more often than directed. Do not stop taking except on the advice of your care team. Talk to your care team about the use of this medication in children. Special care may be needed. Overdosage: If you think you have taken too much of this medicine contact a poison control center or emergency room at once. NOTE: This medicine is only for you. Do not share this medicine with others. What if I miss a dose? If you miss a dose, take it as soon as you can. If it is almost time for your next dose, take only that dose. Do not take double or extra doses. What may interact with this medication? Do not take this medication with any of the following: Linezolid MAOIs like Carbex, Eldepryl, Marplan, Nardil, and Parnate Methylene blue Procarbazine This medication may also interact with the following: Diazepam Digoxin Diltiazem Erythromycin Grapefruit juice Haloperidol Medications for mental  depression or mood problems Medications for seizures like carbamazepine, phenobarbital and phenytoin Nefazodone Other medications for anxiety Rifampin Ritonavir Some antifungal medications like itraconazole, ketoconazole, and voriconazole Verapamil Warfarin This list may not describe all possible interactions. Give your health care provider a list of all the medicines, herbs, non-prescription drugs, or dietary supplements you use. Also tell them if you smoke, drink alcohol, or use illegal drugs. Some items may interact with your medicine. What should I watch for while using this medication? Visit your care team for regular checks on your progress. It may take 1 to 2 weeks before your anxiety gets better. This medication may affect your coordination, reaction time, or judgment. Do not drive or operate machinery until you know how this medication affects you. Sit up or stand slowly to reduce the risk of dizzy or fainting spells. Drinking alcohol with this medication can increase the risk of these side effects. What side effects may I notice from receiving this medication? Side effects that you should report to your care team as soon as possible: Allergic reactions--skin rash, itching, hives, swelling of the face, lips, tongue, or throat Irritability, confusion, fast or irregular heartbeat, muscle stiffness, twitching muscles, sweating, high fever, seizure, chills, vomiting, diarrhea, which may be signs of serotonin syndrome Side effects that usually do not require medical attention (report to your care team if they continue or are bothersome): Anxiety, nervousness Dizziness Drowsiness Headache Nausea Trouble sleeping This list may not describe all possible side effects. Call your doctor for medical advice about side effects. You may report side effects to FDA at 1-800-FDA-1088. Where should I keep  my medication? Keep out of the reach of children. Store at room temperature below 30 degrees C  (86 degrees F). Protect from light. Keep container tightly closed. Throw away any unused medication after the expiration date. NOTE: This sheet is a summary. It may not cover all possible information. If you have questions about this medicine, talk to your doctor, pharmacist, or health care provider.  2024 Elsevier/Gold Standard (2021-11-08 00:00:00)   Meloxicam Capsules What is this medication? MELOXICAM (mel OX i cam) treats mild to moderate pain, inflammation, or arthritis. It works by decreasing inflammation. It belongs to a group of medications called NSAIDs. This medicine may be used for other purposes; ask your health care provider or pharmacist if you have questions. COMMON BRAND NAME(S): Vivlodex What should I tell my care team before I take this medication? They need to know if you have any of these conditions: Asthma (lung or breathing disease) Bleeding disorder Coronary artery bypass graft (CABG) within the past 2 weeks Dehydration Frequently drink alcohol Heart attack Heart disease Heart failure High blood pressure Kidney disease Liver disease Stomach bleeding Stomach ulcers, other stomach or intestine problems Take medications that treat or prevent blood clots Taking other steroids, such as dexamethasone or prednisone Tobacco use An unusual or allergic reaction to meloxicam, other medications, foods, dyes, or preservatives Pregnant or trying to get pregnant Breast-feeding How should I use this medication? Take this medication by mouth. Take it as directed on the prescription label at the same time every day. You can take it with or without food. If it upsets your stomach, take it with food. Do not use it more often than directed. There may be unused or extra doses in the bottle after you finish your treatment. Talk to your care team if you have questions about your dose. A special MedGuide will be given to you by the pharmacist with each prescription and refill. Be  sure to read this information carefully each time. Talk to your care team about the use of this medication in children. Special care may be needed. People over 26 years of age may have a stronger reaction and need a smaller dose. Overdosage: If you think you have taken too much of this medicine contact a poison control center or emergency room at once. NOTE: This medicine is only for you. Do not share this medicine with others. What if I miss a dose? If you miss a dose, take it as soon as you can. If it is almost time for your next dose, take only that dose. Do not take double or extra doses. What may interact with this medication? Do not take this medication with any of the following: Cidofovir Ketorolac This medication may also interact with the following: Alcohol Aspirin and aspirin-like medications Certain medications for blood pressure, heart disease, irregular heart beat Certain medications for mental health conditions Certain medications that treat or prevent blood clots, such as warfarin, enoxaparin, dalteparin, apixaban, dabigatran, rivaroxaban Cyclosporine Diuretics Fluconazole Lithium Methotrexate Other NSAIDs, medications for pain and inflammation, such as ibuprofen and naproxen Pemetrexed This list may not describe all possible interactions. Give your health care provider a list of all the medicines, herbs, non-prescription drugs, or dietary supplements you use. Also tell them if you smoke, drink alcohol, or use illegal drugs. Some items may interact with your medicine. What should I watch for while using this medication? Visit your care team for regular checks on your progress. Tell your care team if your symptoms do not  start to get better or if they get worse. Do not take other medications that contain aspirin, ibuprofen, or naproxen with this medication. Side effects such as stomach upset, nausea, or ulcers may be more likely to occur. Many non-prescription medications  contain aspirin, ibuprofen, or naproxen. Always read labels carefully. This medication can cause serious ulcers and bleeding in the stomach. It can happen with no warning. Tobacco, alcohol, older age, and poor health can also increase risks. Call your care team right away if you have stomach pain or blood in your vomit or stool. This medication does not prevent a heart attack or stroke. This medication may increase the chance of a heart attack or stroke. The chance may increase the longer you use this medication or if you have heart disease. If you take aspirin to prevent a heart attack or stroke, talk to your care team about using this medication. This medication may cause serious skin reactions. They can happen weeks to months after starting the medication. Contact your care team right away if you notice fevers or flu-like symptoms with a rash. The rash may be red or purple and then turn into blisters or peeling of the skin. Or, you might notice a red rash with swelling of the face, lips or lymph nodes in your neck or under your arms. Talk to your care team if you wish to become pregnant or think you might be pregnant. This medication can cause serious birth defects. This medication may affect your coordination, reaction time, or judgment. Do not drive or operate machinery until you know how this medication affects you. Sit up or stand slowly to reduce the risk of dizzy or fainting spells. Drinking alcohol with this medication can increase the risk of these side effects. Be careful brushing or flossing your teeth or using a toothpick because you may get an infection or bleed more easily. If you have any dental work done, tell your dentist you are receiving this medication. This medication may make it more difficult to get pregnant. Talk to your care team if you are concerned about your fertility. What side effects may I notice from receiving this medication? Side effects that you should report to your  care team as soon as possible: Allergic reactions--skin rash, itching, hives, swelling of the face, lips, tongue, or throat Bleeding--bloody or black, tar-like stools, vomiting blood or brown material that looks like coffee grounds, red or dark brown urine, small red or purple spots on skin, unusual bruising or bleeding Heart attack--pain or tightness in the chest, shoulders, arms, or jaw, nausea, shortness of breath, cold or clammy skin, feeling faint or lightheaded Heart failure--shortness of breath, swelling of ankles, feet, or hands, sudden weight gain, unusual weakness or fatigue Increase in blood pressure Kidney injury--decrease in the amount of urine, swelling of the ankles, hands, or feet Liver injury--right upper belly pain, loss of appetite, nausea, light-colored stool, dark yellow or brown urine, yellowing skin or eyes, unusual weakness or fatigue Rash, fever, and swollen lymph nodes Redness, blistering, peeling, or loosening of the skin, including inside the mouth Stroke--sudden numbness or weakness of the face, arm, or leg, trouble speaking, confusion, trouble walking, loss of balance or coordination, dizziness, severe headache, change in vision Side effects that usually do not require medical attention (report to your care team if they continue or are bothersome): Diarrhea Nausea Upset stomach This list may not describe all possible side effects. Call your doctor for medical advice about side effects. You may  report side effects to FDA at 1-800-FDA-1088. Where should I keep my medication? Keep out of the reach of children and pets. Store at room temperature between 20 and 25 degrees C (68 and 77 degrees F). Keep this medication in the original container. Protect from moisture. Keep the container tightly closed. Get rid of any unused medication after the expiration date. To get rid of medications that are no longer needed or have expired: Take the medication to a medication  take-back program. Check with your pharmacy or law enforcement to find a location. If you cannot return the medication, check the label or package insert to see if the medication should be thrown out in the garbage or flushed down the toilet. If you are not sure, ask your care team. If it is safe to put it in the trash, empty the medication out of the container. Mix the medication with cat litter, dirt, coffee grounds, or other unwanted substance. Seal the mixture in a bag or container. Put it in the trash. NOTE: This sheet is a summary. It may not cover all possible information. If you have questions about this medicine, talk to your doctor, pharmacist, or health care provider.  2024 Elsevier/Gold Standard (2020-12-30 00:00:00)

## 2022-11-24 LAB — CBC WITH DIFFERENTIAL/PLATELET
Eosinophils Relative: 0.4 %
Hemoglobin: 14.2 g/dL (ref 11.7–15.5)
Lymphs Abs: 1773 cells/uL (ref 850–3900)
MCH: 31.1 pg (ref 27.0–33.0)
MCV: 91 fL (ref 80.0–100.0)
Neutro Abs: 9318 cells/uL — ABNORMAL HIGH (ref 1500–7800)
Platelets: 230 10*3/uL (ref 140–400)
RBC: 4.57 10*6/uL (ref 3.80–5.10)
RDW: 12.2 % (ref 11.0–15.0)
WBC: 11.9 10*3/uL — ABNORMAL HIGH (ref 3.8–10.8)

## 2022-11-24 LAB — COMPLETE METABOLIC PANEL WITH GFR
ALT: 21 U/L (ref 6–29)
AST: 15 U/L (ref 10–30)
Alkaline phosphatase (APISO): 78 U/L (ref 31–125)
BUN: 10 mg/dL (ref 7–25)
CO2: 25 mmol/L (ref 20–32)
Calcium: 9.1 mg/dL (ref 8.6–10.2)
Chloride: 105 mmol/L (ref 98–110)
Creat: 0.53 mg/dL (ref 0.50–0.97)
Globulin: 3.1 g/dL (calc) (ref 1.9–3.7)
Glucose, Bld: 84 mg/dL (ref 65–99)
Total Protein: 7.1 g/dL (ref 6.1–8.1)
eGFR: 124 mL/min/{1.73_m2} (ref >=60)

## 2022-11-24 LAB — LIPID PANEL
Cholesterol: 176 mg/dL (ref <200)
HDL: 41 mg/dL — ABNORMAL LOW (ref >=50)
LDL Cholesterol (Calc): 118 mg/dL (calc) — ABNORMAL HIGH
Non-HDL Cholesterol (Calc): 135 mg/dL (calc) — ABNORMAL HIGH (ref <130)
Total CHOL/HDL Ratio: 4.3 (calc) (ref <5.0)
Triglycerides: 77 mg/dL (ref <150)

## 2022-11-24 LAB — VITAMIN D 25 HYDROXY (VIT D DEFICIENCY, FRACTURES): Vit D, 25-Hydroxy: 18 ng/mL — ABNORMAL LOW (ref 30–100)

## 2023-02-27 ENCOUNTER — Ambulatory Visit: Payer: BC Managed Care – PPO | Admitting: Nurse Practitioner

## 2023-04-12 ENCOUNTER — Ambulatory Visit: Payer: BC Managed Care – PPO | Admitting: Nurse Practitioner

## 2023-04-12 ENCOUNTER — Encounter: Payer: Self-pay | Admitting: Nurse Practitioner

## 2023-04-12 VITALS — BP 140/92 | HR 87 | Temp 97.4°F | Ht 65.0 in | Wt 213.2 lb

## 2023-04-12 DIAGNOSIS — F419 Anxiety disorder, unspecified: Secondary | ICD-10-CM | POA: Diagnosis not present

## 2023-04-12 DIAGNOSIS — Z1329 Encounter for screening for other suspected endocrine disorder: Secondary | ICD-10-CM

## 2023-04-12 DIAGNOSIS — D509 Iron deficiency anemia, unspecified: Secondary | ICD-10-CM

## 2023-04-12 DIAGNOSIS — E559 Vitamin D deficiency, unspecified: Secondary | ICD-10-CM

## 2023-04-12 DIAGNOSIS — E66811 Obesity, class 1: Secondary | ICD-10-CM | POA: Diagnosis not present

## 2023-04-12 DIAGNOSIS — E782 Mixed hyperlipidemia: Secondary | ICD-10-CM | POA: Diagnosis not present

## 2023-04-12 DIAGNOSIS — R002 Palpitations: Secondary | ICD-10-CM

## 2023-04-12 DIAGNOSIS — G5603 Carpal tunnel syndrome, bilateral upper limbs: Secondary | ICD-10-CM

## 2023-04-12 DIAGNOSIS — R030 Elevated blood-pressure reading, without diagnosis of hypertension: Secondary | ICD-10-CM

## 2023-04-12 DIAGNOSIS — Z79899 Other long term (current) drug therapy: Secondary | ICD-10-CM

## 2023-04-12 NOTE — Progress Notes (Signed)
FOLLOW UP  Assessment and Plan:   Iron deficiency Anemia Continue to monitor CBC with anemia panel Discussed iron rich foods Continue to monitor  Hyperlipidemia Discussed lifestyle modifications. Recommended diet heavy in fruits and veggies, omega 3's. Decrease consumption of animal meats, cheeses, and dairy products. Remain active and exercise as tolerated. Continue to monitor. Check lipids/TSH   Anxiety/Palpitations Zio Patch orders to assess for any underlying etiology contributing to symptoms. Continue Buspar Reviewed relaxation techniques.  Sleep hygiene. Continue Cognitive Behavioral Therapy (CBT) vit telehealth Recommended mindfulness meditation and exercise.   Encouraged personality growth wand development through coping techniques and problem-solving skills. Limit/Decrease/Monitor drug/alcohol intake.    Obesity with co morbidities Trending down  Discussed appropriate BMI Diet modification. Physical activity. Encouraged/praised to build confidence.  Vitamin D deficiency Continue supplement for goal of 60-100 Monitor Vitamin D levels  Medication Management All medications discussed and reviewed in full. All questions and concerns regarding medications addressed.    Nexplanon in place Removed - continue Lo Loestrin   Carpel Tunnel Syndrome Brace support paired with medication/Meloxicam - not effective. Refer to Orthopedics for further review and evaluation. Rest when flared Continue to monitor  Screening for thyroid disorder Check TSH  Elevated BP without diagnosis of HTN Discussed DASH (Dietary Approaches to Stop Hypertension) DASH diet is lower in sodium than a typical American diet. Cut back on foods that are high in saturated fat, cholesterol, and trans fats. Eat more whole-grain foods, fish, poultry, and nuts Remain active and exercise as tolerated daily.  Monitor BP at home-Call if greater than 130/80.  Check CMP/CBC  Orders Placed This  Encounter  Procedures   CBC with Differential/Platelet   COMPLETE METABOLIC PANEL WITH GFR   Lipid panel   TSH   LONG TERM MONITOR (3-14 DAYS)    Standing Status:   Future    Expiration Date:   04/22/2024    Where should this test be performed?:   [facility]    Does the patient have an implanted cardiac device?:   No    Prescribed days of wear:   14    Type of enrollment:   Clinic Enrollment   Notify office for further evaluation and treatment, questions or concerns if any reported s/s fail to improve.   The patient was advised to call back or seek an in-person evaluation if any symptoms worsen or if the condition fails to improve as anticipated.   Further disposition pending results of labs. Discussed med's effects and SE's.    I discussed the assessment and treatment plan with the patient. The patient was provided an opportunity to ask questions and all were answered. The patient agreed with the plan and demonstrated an understanding of the instructions.  Discussed med's effects and SE's. Screening labs and tests as requested with regular follow-up as recommended.  I provided 30 minutes of face-to-face time during this encounter including counseling, chart review, and critical decision making was preformed.  Today's Plan of Care is based on a patient-centered health care approach known as shared decision making - the decisions, tests and treatments allow for patient preferences and values to be balanced with clinical evidence.     Future Appointments  Date Time Provider Department Center  08/01/2023  9:30 AM Adela Glimpse, NP [facility] None   -------------------------------------------------------------  HPI 37 y.o. female  presents for a follow up on hypertension, cholesterol, diabetes, weight and vitamin D deficiency.  She reported during last OV 10/2022 pain in bilateral hands that radiates up  the forearms and down to the finger tips.  Accompanied by numbness and  tingling.  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  She was instructed to brace wrists and start Meloxicam after failing IBU.  She shares today that this has not worked well at controlling her symptoms.    She is also concerned for an increase in heart palpitations.  She does have a hx of anxiety, currently being treated with Buspar.  However, BP is elevated in clinic today.  She feels that palpitations have been more present over the last month.  Denies CP, SOB.    She was  prescribed Prozac 10 mg daily but stopped taking b/c it made her feel on edge.  She continues to suffer from anxiety.  Lost her mother and grandmother over the past year.     She has Nexplanon that has been in place x 6 years and was placed at [facility].  She wanted removed and was referred to GYN.  Now removed and taking oral BC Lo Estrin Fe.   BMI is Body mass index is 35.48 kg/m., she has  been working on diet and exercise. No longer drinking soda.  Wt Readings from Last 3 Encounters:  04/12/23 213 lb 3.2 oz (96.7 kg)  11/23/22 205 lb 3.2 oz (93.1 kg)  10/26/22 208 lb (94.3 kg)    Her blood pressure has been controlled at home, today their BP is BP: (!) 140/92  BP Readings from Last 3 Encounters:  04/12/23 (!) 140/92  11/23/22 112/82  10/26/22 124/82   She does not workout. She denies chest pain, shortness of breath, dizziness.   She is not on cholesterol medication.Her cholesterol is not at goal. The cholesterol last visit was:   Lab Results  Component Value Date   CHOL 188 04/12/2023   HDL 42 (L) 04/12/2023   LDLCALC 126 (H) 04/12/2023   TRIG 92 04/12/2023   CHOLHDL 4.5 04/12/2023    She has not been working on diet and exercise for abnormal glucose. Last A1C in the office was:  Lab Results  Component Value Date   HGBA1C 5.2 12/03/2021   Patient is on Vitamin D supplement.   Lab Results  Component Value Date   VD25OH 18 (L) 11/23/2022        Current  Medications:  Current Outpatient Medications on File Prior to Visit  Medication Sig   albuterol (VENTOLIN HFA) 108 (90 Base) MCG/ACT inhaler Inhale 2 puffs into the lungs every 6 (six) hours as needed for wheezing or shortness of breath.   baclofen (LIORESAL) 10 MG tablet Take 1 tablet (10 mg total) by mouth 2 (two) times daily. Take 1/2 to 1 tablet 2 x day if needed for muscle spasm   busPIRone (BUSPAR) 5 MG tablet Take     1/2 to 1 tablet     3 x /day      for Anxiety   Cholecalciferol (VITAMIN D-3 PO) Take by mouth.   fluticasone (FLONASE) 50 MCG/ACT nasal spray Place 2 sprays into both nostrils daily.   meloxicam (MOBIC) 7.5 MG tablet Take 1 tablet (7.5 mg total) by mouth daily.   Norethindrone-Ethinyl Estradiol-Fe Biphas (LO LOESTRIN FE) 1 MG-10 MCG / 10 MCG tablet Take 1 tablet by mouth daily.   No current facility-administered medications on file prior to visit.     Allergies: No Known Allergies   Medical History:  Past Medical History:  Diagnosis Date  Anxiety    Asthma    Migraine headache    Scoliosis    Family history- Reviewed and unchanged Social history- Reviewed and unchanged   Review of Systems:  Review of Systems  Constitutional:  Negative for chills, fever and weight loss.  HENT:  Negative for congestion and hearing loss.   Eyes:  Negative for blurred vision and double vision.  Respiratory:  Negative for cough and shortness of breath.   Cardiovascular:  Negative for chest pain, palpitations, orthopnea and leg swelling.  Gastrointestinal:  Negative for abdominal pain, constipation, diarrhea, heartburn, nausea and vomiting.  Musculoskeletal:  Negative for falls, joint pain and myalgias.  Skin:  Negative for rash.  Neurological:  Negative for dizziness, tingling, tremors, loss of consciousness and headaches.  Psychiatric/Behavioral:  Negative for depression, memory loss and suicidal ideas.       Physical Exam: BP (!) 140/92   Pulse 87   Temp (!) 97.4 F  (36.3 C)   Ht 5\' 5"  (1.651 m)   Wt 213 lb 3.2 oz (96.7 kg)   SpO2 99%   BMI 35.48 kg/m  Wt Readings from Last 3 Encounters:  04/12/23 213 lb 3.2 oz (96.7 kg)  11/23/22 205 lb 3.2 oz (93.1 kg)  10/26/22 208 lb (94.3 kg)   General Appearance: Obese pleasant female, in no apparent distress. Eyes: PERRLA, EOMs, conjunctiva no swelling or erythema Sinuses: No Frontal/maxillary tenderness ENT/Mouth: Ext aud canals clear, TMs without erythema, bulging. No erythema, swelling, or exudate on post pharynx Missing all teeth Neck: Supple, thyroid normal.  Respiratory: Respiratory effort normal, BS equal bilaterally without rales, rhonchi, wheezing or stridor.  Cardio: RRR with no MRGs. Brisk peripheral pulses without edema.  Abdomen: Soft, + BS.  Non tender, no guarding, rebound, hernias, masses. Lymphatics: Non tender without lymphadenopathy.  Musculoskeletal: Full ROM, 5/5 strength, Normal gait Skin: Warm, dry without rashes, lesions, ecchymosis.  Neuro: Cranial nerves intact. No cerebellar symptoms.  Psych: Awake and oriented X 3, normal affect, Insight and Judgment appropriate.    Adela Glimpse, NP 10:12 PM [facility]

## 2023-04-13 ENCOUNTER — Encounter: Payer: Self-pay | Admitting: Nurse Practitioner

## 2023-04-13 LAB — CBC WITH DIFFERENTIAL/PLATELET
Absolute Lymphocytes: 3316 {cells}/uL (ref 850–3900)
Absolute Monocytes: 616 {cells}/uL (ref 200–950)
Basophils Absolute: 22 {cells}/uL (ref 0–200)
Basophils Relative: 0.2 %
Eosinophils Absolute: 97 {cells}/uL (ref 15–500)
Eosinophils Relative: 0.9 %
HCT: 45.2 % — ABNORMAL HIGH (ref 35.0–45.0)
Hemoglobin: 15.3 g/dL (ref 11.7–15.5)
MCH: 31.2 pg (ref 27.0–33.0)
MCHC: 33.8 g/dL (ref 32.0–36.0)
MCV: 92.1 fL (ref 80.0–100.0)
MPV: 11.6 fL (ref 7.5–12.5)
Monocytes Relative: 5.7 %
Neutro Abs: 6750 {cells}/uL (ref 1500–7800)
Neutrophils Relative %: 62.5 %
Platelets: 288 10*3/uL (ref 140–400)
RBC: 4.91 10*6/uL (ref 3.80–5.10)
RDW: 11.9 % (ref 11.0–15.0)
Total Lymphocyte: 30.7 %
WBC: 10.8 10*3/uL (ref 3.8–10.8)

## 2023-04-13 LAB — LIPID PANEL
Cholesterol: 188 mg/dL (ref <200)
HDL: 42 mg/dL — ABNORMAL LOW (ref >=50)
LDL Cholesterol (Calc): 126 mg/dL — ABNORMAL HIGH
Non-HDL Cholesterol (Calc): 146 mg/dL — ABNORMAL HIGH (ref <130)
Total CHOL/HDL Ratio: 4.5 (calc) (ref <5.0)
Triglycerides: 92 mg/dL (ref <150)

## 2023-04-13 LAB — COMPLETE METABOLIC PANEL WITH GFR
AG Ratio: 1.3 (calc) (ref 1.0–2.5)
ALT: 15 U/L (ref 6–29)
AST: 13 U/L (ref 10–30)
Albumin: 4.2 g/dL (ref 3.6–5.1)
Alkaline phosphatase (APISO): 89 U/L (ref 31–125)
BUN: 10 mg/dL (ref 7–25)
CO2: 30 mmol/L (ref 20–32)
Calcium: 9.8 mg/dL (ref 8.6–10.2)
Chloride: 101 mmol/L (ref 98–110)
Creat: 0.54 mg/dL (ref 0.50–0.97)
Globulin: 3.2 g/dL (ref 1.9–3.7)
Glucose, Bld: 68 mg/dL (ref 65–99)
Potassium: 4.4 mmol/L (ref 3.5–5.3)
Sodium: 139 mmol/L (ref 135–146)
Total Bilirubin: 0.6 mg/dL (ref 0.2–1.2)
Total Protein: 7.4 g/dL (ref 6.1–8.1)
eGFR: 123 mL/min/{1.73_m2} (ref >=60)

## 2023-04-13 LAB — TSH: TSH: 0.77 m[IU]/L

## 2023-04-13 MED ORDER — PREDNISONE 10 MG PO TABS: ORAL_TABLET | ORAL | 0 refills | Status: DC

## 2023-04-14 ENCOUNTER — Encounter: Payer: Self-pay | Admitting: Nurse Practitioner

## 2023-04-14 ENCOUNTER — Other Ambulatory Visit: Payer: Self-pay | Admitting: Nurse Practitioner

## 2023-04-14 DIAGNOSIS — E782 Mixed hyperlipidemia: Secondary | ICD-10-CM

## 2023-04-14 MED ORDER — ATORVASTATIN CALCIUM 10 MG PO TABS: 10.0000 mg | ORAL_TABLET | Freq: Every day | ORAL | 11 refills | Status: AC

## 2023-04-23 NOTE — Patient Instructions (Signed)
Palpitations Palpitations are feelings that your heartbeat is not normal. Your heartbeat may feel like it is: Uneven (irregular). Faster than normal. Fluttering. Skipping a beat. This is usually not a serious problem. However, a doctor will do tests and check your medical history to make sure that you do not have a serious heart problem. Follow these instructions at home: Watch for any changes in your condition. Tell your doctor about any changes. Take these actions to help manage your symptoms: Eating and drinking Follow instructions from your doctor about things to eat and drink. You may be told to avoid these things: Drinks that have caffeine in them, such as coffee, tea, soft drinks, and energy drinks. Chocolate. Alcohol. Diet pills. Lifestyle     Try to lower your stress. These things can help you relax: Yoga. Deep breathing and meditation. Guided imagery. This is using words and images to create positive thoughts. Exercise, including swimming, jogging, and walking. Tell your doctor if you have more abnormal heartbeats when you are active. If you have chest pain or feel short of breath with exercise, do not keep doing the exercise until you are seen by your doctor. Biofeedback. This is using your mind to control things in your body, such as your heartbeat. Get plenty of rest and sleep. Keep a regular bed time. Do not use drugs, such as cocaine or ecstasy. Do not use marijuana. Do not smoke or use any products that contain nicotine or tobacco. If you need help quitting, ask your doctor. General instructions Take over-the-counter and prescription medicines only as told by your doctor. Keep all follow-up visits. You may need more tests if palpitations do not go away or get worse. Contact a doctor if: You keep having fast or uneven heartbeats for a long time. Your symptoms happen more often. Get help right away if: You have chest pain. You feel short of breath. You have a very  bad headache. You feel dizzy. You faint. These symptoms may be an emergency. Get help right away. Call your local emergency services (911 in the [place] Do not wait to see if the symptoms will go away. Do not drive yourself to the hospital. Summary Palpitations are feelings that your heartbeat is uneven or faster than normal. It may feel like your heart is fluttering or skipping a beat. Avoid food and drinks that may cause this condition. These include caffeine, chocolate, and alcohol. Try to lower your stress. Do not smoke or use drugs. Get help right away if you faint, feel dizzy, feel short of breath, have chest pain, or have a very bad headache. This information is not intended to replace advice given to you by your health care provider. Make sure you discuss any questions you have with your health care provider. Document Revised: 09/09/2020 Document Reviewed: 09/09/2020 Elsevier Patient Education  2024 Elsevier Inc.  

## 2023-04-24 ENCOUNTER — Ambulatory Visit: Payer: BC Managed Care – PPO | Attending: Nurse Practitioner

## 2023-04-24 DIAGNOSIS — R002 Palpitations: Secondary | ICD-10-CM

## 2023-04-24 NOTE — Progress Notes (Unsigned)
Enrolled for [employer] to mail a ZIO XT long term holter monitor to the patients address on file.   EP to read.

## 2023-04-28 DIAGNOSIS — R002 Palpitations: Secondary | ICD-10-CM | POA: Diagnosis not present

## 2023-05-18 DIAGNOSIS — R002 Palpitations: Secondary | ICD-10-CM | POA: Diagnosis not present

## 2023-05-29 ENCOUNTER — Encounter: Payer: Self-pay | Admitting: Nurse Practitioner

## 2023-06-20 DIAGNOSIS — G5603 Carpal tunnel syndrome, bilateral upper limbs: Secondary | ICD-10-CM | POA: Diagnosis not present

## 2023-07-28 DIAGNOSIS — G5603 Carpal tunnel syndrome, bilateral upper limbs: Secondary | ICD-10-CM | POA: Diagnosis not present

## 2023-08-01 ENCOUNTER — Ambulatory Visit: Payer: BC Managed Care – PPO | Admitting: Nurse Practitioner

## 2023-08-25 DIAGNOSIS — G5603 Carpal tunnel syndrome, bilateral upper limbs: Secondary | ICD-10-CM | POA: Diagnosis not present

## 2024-02-01 ENCOUNTER — Telehealth: Payer: Self-pay

## 2024-02-01 NOTE — Telephone Encounter (Signed)
 Spoke with patient and confirmed appointment for 10/3.SABRA

## 2024-02-02 ENCOUNTER — Inpatient Hospital Stay: Attending: Hematology and Oncology | Admitting: Hematology and Oncology

## 2024-02-02 ENCOUNTER — Inpatient Hospital Stay

## 2024-02-02 VITALS — BP 136/88 | HR 95 | Temp 97.7°F | Resp 18 | Ht 64.0 in | Wt 223.7 lb

## 2024-02-02 DIAGNOSIS — E669 Obesity, unspecified: Secondary | ICD-10-CM | POA: Diagnosis not present

## 2024-02-02 DIAGNOSIS — E785 Hyperlipidemia, unspecified: Secondary | ICD-10-CM | POA: Insufficient documentation

## 2024-02-02 DIAGNOSIS — G43909 Migraine, unspecified, not intractable, without status migrainosus: Secondary | ICD-10-CM | POA: Insufficient documentation

## 2024-02-02 DIAGNOSIS — D72820 Lymphocytosis (symptomatic): Secondary | ICD-10-CM | POA: Insufficient documentation

## 2024-02-02 DIAGNOSIS — D72825 Bandemia: Secondary | ICD-10-CM

## 2024-02-02 LAB — CBC WITH DIFFERENTIAL/PLATELET
Abs Immature Granulocytes: 0.04 K/uL (ref 0.00–0.07)
Basophils Absolute: 0 K/uL (ref 0.0–0.1)
Basophils Relative: 0 %
Eosinophils Absolute: 0.1 K/uL (ref 0.0–0.5)
Eosinophils Relative: 1 %
HCT: 40.7 % (ref 36.0–46.0)
Hemoglobin: 14.1 g/dL (ref 12.0–15.0)
Immature Granulocytes: 0 %
Lymphocytes Relative: 20 %
Lymphs Abs: 2.2 K/uL (ref 0.7–4.0)
MCH: 31 pg (ref 26.0–34.0)
MCHC: 34.6 g/dL (ref 30.0–36.0)
MCV: 89.5 fL (ref 80.0–100.0)
Monocytes Absolute: 0.7 K/uL (ref 0.1–1.0)
Monocytes Relative: 7 %
Neutro Abs: 8 K/uL — ABNORMAL HIGH (ref 1.7–7.7)
Neutrophils Relative %: 72 %
Platelets: 218 K/uL (ref 150–400)
RBC: 4.55 MIL/uL (ref 3.87–5.11)
RDW: 12.9 % (ref 11.5–15.5)
WBC: 11.1 K/uL — ABNORMAL HIGH (ref 4.0–10.5)
nRBC: 0 % (ref 0.0–0.2)

## 2024-02-02 LAB — CMP (CANCER CENTER ONLY)
ALT: 21 U/L (ref 0–44)
AST: 14 U/L — ABNORMAL LOW (ref 15–41)
Albumin: 3.9 g/dL (ref 3.5–5.0)
Alkaline Phosphatase: 79 U/L (ref 38–126)
Anion gap: 4 — ABNORMAL LOW (ref 5–15)
BUN: 8 mg/dL (ref 6–20)
CO2: 28 mmol/L (ref 22–32)
Calcium: 9.4 mg/dL (ref 8.9–10.3)
Chloride: 106 mmol/L (ref 98–111)
Creatinine: 0.5 mg/dL (ref 0.44–1.00)
GFR, Estimated: >60 mL/min (ref >60)
Glucose, Bld: 89 mg/dL (ref 70–99)
Potassium: 4 mmol/L (ref 3.5–5.1)
Sodium: 138 mmol/L (ref 135–145)
Total Bilirubin: 0.7 mg/dL (ref 0.0–1.2)
Total Protein: 7.2 g/dL (ref 6.5–8.1)

## 2024-02-02 LAB — TSH: TSH: 0.408 u[IU]/mL (ref 0.350–4.500)

## 2024-02-02 LAB — C-REACTIVE PROTEIN: CRP: 2.6 mg/dL — ABNORMAL HIGH (ref <1.0)

## 2024-02-02 NOTE — Progress Notes (Signed)
 [facility] CONSULT NOTE  Patient Care Team: Laurice President, NP as PCP - General (Nurse Practitioner)  CHIEF COMPLAINTS/PURPOSE OF CONSULTATION:  Leukocytosis.  ASSESSMENT & PLAN: Assessment & Plan  This is a very pleasant 38 year old female patient with past medical history significant for hyperlipidemia, obesity, migraines who was referred to hematology for evaluation of intermittent leukocytosis.  She tells me that she has had high white blood cell count for several years.  She is asymptomatic from this.  She does report a lot of stress in the past year losing her mother and taking care of her autistic child. Besides stress, she has some ongoing pain issues radiating from bilateral elbows to her hands, she is following up with orthopedics for possible nerve entrapment.  She has had recent sinusitis otherwise does not report any chronic infections, no autoimmune diseases.  No known dental issues, she has had bad teeth and has had all her teeth removed. On physical exam, she appears alert, oriented, no acute distress, no palpable lymphadenopathy, is obese, no palpable hepatosplenomegaly. I reviewed her labs over the past several years, she does have intermittent leukocytosis but most of her white blood cell count range is about the limit of normal.  Most of this is bandemia, occasional lymphocytosis.  Recommended further evaluation including labs, CBC, CMP, smear review, ANA, inflammatory markers, thyroid testing and flow cytometry.  If the above-mentioned labs are unremarkable, I do believe this is likely reactive leukocytosis secondary to stress, pain/body mass index.  If she does have persistent and progressive leukocytosis, please feel free to rerefer her to hematology.  HISTORY OF PRESENTING ILLNESS:  Samantha Nicholson 38 y.o. female is here because of leukocytosis  Discussed the use of AI scribe software for clinical note transcription with the patient, who gave verbal consent  to proceed.  History of Present Illness Samantha Nicholson is a 38 year old female who presents for evaluation of elevated white blood cell count. She was referred by Dr. Glenys Laurice for evaluation of elevated white blood cell counts.  She has experienced elevated white blood cell counts for an extended period, with fluctuations ranging from 9.5 to 14 thousand, predominantly neutrophils. No new symptoms such as fevers, drenching night sweats, or changes in appetite have been noted.  She experiences episodes of heart rate fluctuations, with rates reaching 150-160 bpm and as low as 40 bpm. A previous heart monitor did not reveal abnormalities. These fluctuations have been primarily noted by her watch.  She experiences pain starting in her elbow and radiating down her arm, initially suspected to be carpal tunnel syndrome.  She has been referred to an orthopedic specialist but has not yet attended the appointment.  She experiences occasional migraines, which have been present for years.  Her past medical history includes high cholesterol, for which she takes atorvastatin , and she also uses birth control. She occasionally uses albuterol  for allergies. She reports poor sleep, getting only four to five hours per night, and attributes some of her stress to the recent loss of her mother to cancer and challenges related to her son's autism and creatine deficiency.  All other systems were reviewed with the patient and are negative.  MEDICAL HISTORY:  Past Medical History:  Diagnosis Date   Anxiety    Asthma    Migraine headache    Scoliosis     SURGICAL HISTORY: Past Surgical History:  Procedure Laterality Date   MOUTH SURGERY     TONSILLECTOMY  SOCIAL HISTORY: Social History   Socioeconomic History   Marital status: Single    Spouse name: Not on file   Number of children: Not on file   Years of education: Not on file   Highest education level: Not on file  Occupational History    Not on file  Tobacco Use   Smoking status: Never   Smokeless tobacco: Never  Substance and Sexual Activity   Alcohol use: No   Drug use: No   Sexual activity: Not Currently    Partners: Male    Birth control/protection: Pill  Other Topics Concern   Not on file  Social History Narrative   Not on file   Social Drivers of Health   Financial Resource Strain: Not on file  Food Insecurity: Not on file  Transportation Needs: Not on file  Physical Activity: Not on file  Stress: Not on file  Social Connections: Not on file  Intimate Partner Violence: Not on file    FAMILY HISTORY: Family History  Problem Relation Age of Onset   Cancer Mother        Ureter Cancer   Diabetes Father    Heart disease Father    Kidney disease Father    Heart attack Father    Hypertension Brother    Autism Brother    Heart disease Maternal Grandmother    Heart attack Maternal Grandmother    Diabetes Maternal Grandmother    Asthma Daughter    Autism Son     ALLERGIES:  has no known allergies.  MEDICATIONS:  Current Outpatient Medications  Medication Sig Dispense Refill   albuterol  (VENTOLIN  HFA) 108 (90 Base) MCG/ACT inhaler Inhale 2 puffs into the lungs every 6 (six) hours as needed for wheezing or shortness of breath. 8 g 2   atorvastatin  (LIPITOR) 10 MG tablet Take 1 tablet (10 mg total) by mouth daily. 30 tablet 11   busPIRone  (BUSPAR ) 5 MG tablet Take     1/2 to 1 tablet     3 x /day      for Anxiety 90 tablet 1   Cholecalciferol (VITAMIN D -3 PO) Take by mouth.     fluticasone  (FLONASE ) 50 MCG/ACT nasal spray Place 2 sprays into both nostrils daily. 16 g 0   Norethindrone-Ethinyl Estradiol-Fe Biphas (LO LOESTRIN FE) 1 MG-10 MCG / 10 MCG tablet Take 1 tablet by mouth daily. 84 tablet 4   baclofen  (LIORESAL ) 10 MG tablet Take 1 tablet (10 mg total) by mouth 2 (two) times daily. Take 1/2 to 1 tablet 2 x day if needed for muscle spasm (Patient not taking: Reported on 02/02/2024) 60 tablet 1    meloxicam  (MOBIC ) 7.5 MG tablet Take 1 tablet (7.5 mg total) by mouth daily. (Patient not taking: Reported on 02/02/2024) 30 tablet 1   No current facility-administered medications for this visit.     PHYSICAL EXAMINATION: ECOG PERFORMANCE STATUS: 0 - Asymptomatic  Vitals:   02/02/24 1113  BP: 136/88  Pulse: 95  Resp: 18  Temp: 97.7 F (36.5 C)  SpO2: 96%   Filed Weights   02/02/24 1113  Weight: 223 lb 11.2 oz (101.5 kg)    GENERAL:alert, no distress and comfortable, obese. SKIN: skin color, texture, turgor are normal, no rashes or significant lesions EYES: normal, conjunctiva are pink and non-injected, sclera clear OROPHARYNX: no teeth NECK: supple, thyroid normal size, non-tender, without nodularity, very taut SCM muscle on the left neck. LYMPH:  no palpable lymphadenopathy in the cervical, axillary  LUNGS:  clear to auscultation and percussion with normal breathing effort HEART: regular rate & rhythm and no murmurs and no lower extremity edema ABDOMEN:abdomen soft, non-tender and normal bowel sounds Musculoskeletal:no cyanosis of digits and no clubbing  PSYCH: alert & oriented x 3 with fluent speech NEURO: no focal motor/sensory deficits  LABORATORY DATA:  I have reviewed the data as listed Lab Results  Component Value Date   WBC 10.8 04/12/2023   HGB 15.3 04/12/2023   HCT 45.2 (H) 04/12/2023   MCV 92.1 04/12/2023   PLT 288 04/12/2023     Chemistry      Component Value Date/Time   NA 139 04/12/2023 1625   K 4.4 04/12/2023 1625   CL 101 04/12/2023 1625   CO2 30 04/12/2023 1625   BUN 10 04/12/2023 1625   CREATININE 0.54 04/12/2023 1625      Component Value Date/Time   CALCIUM  9.8 04/12/2023 1625   ALKPHOS 65 11/18/2012 2149   AST 13 04/12/2023 1625   ALT 15 04/12/2023 1625   BILITOT 0.6 04/12/2023 1625       RADIOGRAPHIC STUDIES: I have personally reviewed the radiological images as listed and agreed with the findings in the report. No results  found.  All questions were answered. The patient knows to call the clinic with any problems, questions or concerns. I spent 45 minutes in the care of this patient including H and P, review of records, counseling and coordination of care.     Amber Stalls, MD 02/02/2024 11:19 AM

## 2024-02-02 NOTE — Progress Notes (Deleted)
 [facility] CONSULT NOTE  Patient Care Team: Laurice President, NP as PCP - General (Nurse Practitioner)  CHIEF COMPLAINTS/PURPOSE OF CONSULTATION:  ***  ASSESSMENT & PLAN:  No problem-specific Assessment & Plan notes found for this encounter.  No orders of the defined types were placed in this encounter.    HISTORY OF PRESENTING ILLNESS:  Samantha Nicholson 38 y.o. female is here because of ***  REVIEW OF SYSTEMS:   Constitutional: Denies fevers, chills or abnormal night sweats Eyes: Denies blurriness of vision, double vision or watery eyes Ears, nose, mouth, throat, and face: Denies mucositis or sore throat Respiratory: Denies cough, dyspnea or wheezes Cardiovascular: Denies palpitation, chest discomfort or lower extremity swelling Gastrointestinal:  Denies nausea, heartburn or change in bowel habits Skin: Denies abnormal skin rashes Lymphatics: Denies new lymphadenopathy or easy bruising Neurological:Denies numbness, tingling or new weaknesses Behavioral/Psych: Mood is stable, no new changes  All other systems were reviewed with the patient and are negative.  MEDICAL HISTORY:  Past Medical History:  Diagnosis Date   Anxiety    Asthma    Migraine headache    Scoliosis     SURGICAL HISTORY: Past Surgical History:  Procedure Laterality Date   MOUTH SURGERY     TONSILLECTOMY      SOCIAL HISTORY: Social History   Socioeconomic History   Marital status: Single    Spouse name: Not on file   Number of children: Not on file   Years of education: Not on file   Highest education level: Not on file  Occupational History   Not on file  Tobacco Use   Smoking status: Never   Smokeless tobacco: Never  Substance and Sexual Activity   Alcohol use: No   Drug use: No   Sexual activity: Not Currently    Partners: Male    Birth control/protection: Pill  Other Topics Concern   Not on file  Social History Narrative   Not on file   Social Drivers of Health    Financial Resource Strain: Not on file  Food Insecurity: Not on file  Transportation Needs: Not on file  Physical Activity: Not on file  Stress: Not on file  Social Connections: Not on file  Intimate Partner Violence: Not on file    FAMILY HISTORY: Family History  Problem Relation Age of Onset   Cancer Mother        Ureter Cancer   Diabetes Father    Heart disease Father    Kidney disease Father    Heart attack Father    Hypertension Brother    Autism Brother    Heart disease Maternal Grandmother    Heart attack Maternal Grandmother    Diabetes Maternal Grandmother    Asthma Daughter    Autism Son     ALLERGIES:  has no known allergies.  MEDICATIONS:  Current Outpatient Medications  Medication Sig Dispense Refill   albuterol  (VENTOLIN  HFA) 108 (90 Base) MCG/ACT inhaler Inhale 2 puffs into the lungs every 6 (six) hours as needed for wheezing or shortness of breath. 8 g 2   atorvastatin  (LIPITOR) 10 MG tablet Take 1 tablet (10 mg total) by mouth daily. 30 tablet 11   busPIRone  (BUSPAR ) 5 MG tablet Take     1/2 to 1 tablet     3 x /day      for Anxiety 90 tablet 1   Cholecalciferol (VITAMIN D -3 PO) Take by mouth.     fluticasone  (FLONASE ) 50 MCG/ACT nasal spray  Place 2 sprays into both nostrils daily. 16 g 0   Norethindrone-Ethinyl Estradiol-Fe Biphas (LO LOESTRIN FE) 1 MG-10 MCG / 10 MCG tablet Take 1 tablet by mouth daily. 84 tablet 4   baclofen  (LIORESAL ) 10 MG tablet Take 1 tablet (10 mg total) by mouth 2 (two) times daily. Take 1/2 to 1 tablet 2 x day if needed for muscle spasm (Patient not taking: Reported on 02/02/2024) 60 tablet 1   meloxicam  (MOBIC ) 7.5 MG tablet Take 1 tablet (7.5 mg total) by mouth daily. (Patient not taking: Reported on 02/02/2024) 30 tablet 1   No current facility-administered medications for this visit.     PHYSICAL EXAMINATION: ECOG PERFORMANCE STATUS: {CHL ONC ECOG ED:[id number]}  Vitals:   02/02/24 1113  BP: 136/88  Pulse: 95  Resp:  18  Temp: 97.7 F (36.5 C)  SpO2: 96%   Filed Weights   02/02/24 1113  Weight: 223 lb 11.2 oz (101.5 kg)    GENERAL:alert, no distress and comfortable SKIN: skin color, texture, turgor are normal, no rashes or significant lesions EYES: normal, conjunctiva are pink and non-injected, sclera clear OROPHARYNX:no exudate, no erythema and lips, buccal mucosa, and tongue normal  NECK: supple, thyroid normal size, non-tender, without nodularity LYMPH:  no palpable lymphadenopathy in the cervical, axillary or inguinal LUNGS: clear to auscultation and percussion with normal breathing effort HEART: regular rate & rhythm and no murmurs and no lower extremity edema ABDOMEN:abdomen soft, non-tender and normal bowel sounds Musculoskeletal:no cyanosis of digits and no clubbing  PSYCH: alert & oriented x 3 with fluent speech NEURO: no focal motor/sensory deficits  LABORATORY DATA:  I have reviewed the data as listed Lab Results  Component Value Date   WBC 10.8 04/12/2023   HGB 15.3 04/12/2023   HCT 45.2 (H) 04/12/2023   MCV 92.1 04/12/2023   PLT 288 04/12/2023     Chemistry      Component Value Date/Time   NA 139 04/12/2023 1625   K 4.4 04/12/2023 1625   CL 101 04/12/2023 1625   CO2 30 04/12/2023 1625   BUN 10 04/12/2023 1625   CREATININE 0.54 04/12/2023 1625      Component Value Date/Time   CALCIUM  9.8 04/12/2023 1625   ALKPHOS 65 11/18/2012 2149   AST 13 04/12/2023 1625   ALT 15 04/12/2023 1625   BILITOT 0.6 04/12/2023 1625       RADIOGRAPHIC STUDIES: I have personally reviewed the radiological images as listed and agreed with the findings in the report. No results found.  All questions were answered. The patient knows to call the clinic with any problems, questions or concerns. I spent *** minutes in the care of this patient including H and P, review of records, counseling and coordination of care.     Amber Stalls, MD 02/02/2024 11:28 AM

## 2024-02-05 LAB — ANTINUCLEAR ANTIBODIES, IFA: ANA Ab, IFA: NEGATIVE

## 2024-02-06 LAB — BCR-ABL1 FISH
Cells Analyzed: 200
Cells Counted: 200

## 2024-03-01 ENCOUNTER — Inpatient Hospital Stay: Admitting: Hematology and Oncology

## 2024-03-14 ENCOUNTER — Telehealth: Payer: Self-pay

## 2024-03-14 NOTE — Telephone Encounter (Signed)
 Spoke with patient and confirmed appointment on 11/14

## 2024-03-15 ENCOUNTER — Inpatient Hospital Stay: Attending: Hematology and Oncology | Admitting: Hematology and Oncology

## 2024-03-15 VITALS — BP 118/84 | HR 79 | Temp 98.4°F | Resp 18 | Ht 64.0 in | Wt 223.6 lb

## 2024-03-15 DIAGNOSIS — D72829 Elevated white blood cell count, unspecified: Secondary | ICD-10-CM | POA: Insufficient documentation

## 2024-03-15 DIAGNOSIS — D72825 Bandemia: Secondary | ICD-10-CM

## 2024-03-15 DIAGNOSIS — Z79899 Other long term (current) drug therapy: Secondary | ICD-10-CM | POA: Insufficient documentation

## 2024-03-15 NOTE — Progress Notes (Signed)
 [facility] CONSULT NOTE  Patient Care Team: Laurice President, NP as PCP - General (Nurse Practitioner)  CHIEF COMPLAINTS/PURPOSE OF CONSULTATION:  Leukocytosis.  ASSESSMENT & PLAN: Assessment & Plan  Chronic mildly elevated white blood cell count Chronic mildly elevated white blood cell count, likely reactive due to stress or inflammation or body habitus. No current concern for underlying pathology. - Continue annual monitoring of white blood cell count. - Advised to report symptoms such as fevers, night sweats, unintentional weight loss, or recurrent infections.  Lab fluctuations in heart rate (tachycardia and bradycardia) with palpitations Intermittent tachycardia and bradycardia with palpitations, possibly stress-related.  - She will discuss with her PCP for cardiology referral.  RTC in 1 yr.  HISTORY OF PRESENTING ILLNESS:  Samantha Nicholson 38 y.o. female is here because of leukocytosis  Discussed the use of AI scribe software for clinical note transcription with the patient, who gave verbal consent to proceed.  History of Present Illness  Samantha Nicholson is a 38 year old female with leukocytosis.  She experiences episodes of heart palpitations and fluctuating heart rate, describing her heart as 'beating really, really fast' and then slowing down significantly. These episodes are accompanied by severe migraines and arm soreness, typically lasting 15 to 20 minutes. The most recent episode occurred two days ago, triggered by the emotional stress of having her dog put down. A similar episode was noted around [place].  She has undergone heart monitoring, which did not reveal any conclusive findings. Her heart rate can fluctuate significantly, reaching up to 140 beats per minute during activity and dropping to the 40s.  Her blood work shows a mildly elevated white blood cell count, consistently around 11,000 over the years. Other tests, including autoimmune markers,  metabolic panel, and thyroid function, have returned normal results. She has a history of elevated inflammatory markers, possibly related to stress or other inflammation.  No fevers, drenching night sweats, unintentional weight loss.  All other systems were reviewed with the patient and are negative.  MEDICAL HISTORY:  Past Medical History:  Diagnosis Date   Anxiety    Asthma    Migraine headache    Scoliosis     SURGICAL HISTORY: Past Surgical History:  Procedure Laterality Date   MOUTH SURGERY     TONSILLECTOMY      SOCIAL HISTORY: Social History   Socioeconomic History   Marital status: Single    Spouse name: Not on file   Number of children: Not on file   Years of education: Not on file   Highest education level: Not on file  Occupational History   Not on file  Tobacco Use   Smoking status: Never   Smokeless tobacco: Never  Substance and Sexual Activity   Alcohol use: No   Drug use: No   Sexual activity: Not Currently    Partners: Male    Birth control/protection: Pill  Other Topics Concern   Not on file  Social History Narrative   Not on file   Social Drivers of Health   Financial Resource Strain: Not on file  Food Insecurity: No Food Insecurity (02/02/2024)   Hunger Vital Sign    Worried About Running Out of Food in the Last Year: Never true    Ran Out of Food in the Last Year: Never true  Transportation Needs: No Transportation Needs (02/02/2024)   PRAPARE - Administrator, Civil Service (Medical): No    Lack of Transportation (Non-Medical): No  Physical Activity: Not on file  Stress: Not on file  Social Connections: Not on file  Intimate Partner Violence: Not At Risk (02/02/2024)   Humiliation, Afraid, Rape, and Kick questionnaire    Fear of Current or Ex-Partner: No    Emotionally Abused: No    Physically Abused: No    Sexually Abused: No    FAMILY HISTORY: Family History  Problem Relation Age of Onset   Cancer Mother         Ureter Cancer   Diabetes Father    Heart disease Father    Kidney disease Father    Heart attack Father    Hypertension Brother    Autism Brother    Heart disease Maternal Grandmother    Heart attack Maternal Grandmother    Diabetes Maternal Grandmother    Asthma Daughter    Autism Son     ALLERGIES:  has no known allergies.  MEDICATIONS:  Current Outpatient Medications  Medication Sig Dispense Refill   albuterol  (VENTOLIN  HFA) 108 (90 Base) MCG/ACT inhaler Inhale 2 puffs into the lungs every 6 (six) hours as needed for wheezing or shortness of breath. 8 g 2   atorvastatin  (LIPITOR) 10 MG tablet Take 1 tablet (10 mg total) by mouth daily. 30 tablet 11   busPIRone  (BUSPAR ) 5 MG tablet Take     1/2 to 1 tablet     3 x /day      for Anxiety 90 tablet 1   Cholecalciferol (VITAMIN D -3 PO) Take by mouth.     fluticasone  (FLONASE ) 50 MCG/ACT nasal spray Place 2 sprays into both nostrils daily. 16 g 0   baclofen  (LIORESAL ) 10 MG tablet Take 1 tablet (10 mg total) by mouth 2 (two) times daily. Take 1/2 to 1 tablet 2 x day if needed for muscle spasm (Patient not taking: Reported on 03/15/2024) 60 tablet 1   meloxicam  (MOBIC ) 7.5 MG tablet Take 1 tablet (7.5 mg total) by mouth daily. (Patient not taking: Reported on 03/15/2024) 30 tablet 1   No current facility-administered medications for this visit.     PHYSICAL EXAMINATION: ECOG PERFORMANCE STATUS: 0 - Asymptomatic  Vitals:   03/15/24 0904  BP: 118/84  Pulse: 79  Resp: 18  Temp: 98.4 F (36.9 C)  SpO2: 98%   Filed Weights   03/15/24 0904  Weight: 223 lb 9.6 oz (101.4 kg)    GENERAL:alert, no distress and comfortable, obese.   LABORATORY DATA:  I have reviewed the data as listed Lab Results  Component Value Date   WBC 11.1 (H) 02/02/2024   HGB 14.1 02/02/2024   HCT 40.7 02/02/2024   MCV 89.5 02/02/2024   PLT 218 02/02/2024     Chemistry      Component Value Date/Time   NA 138 02/02/2024 1220   K 4.0 02/02/2024  1220   CL 106 02/02/2024 1220   CO2 28 02/02/2024 1220   BUN 8 02/02/2024 1220   CREATININE 0.50 02/02/2024 1220   CREATININE 0.54 04/12/2023 1625      Component Value Date/Time   CALCIUM  9.4 02/02/2024 1220   ALKPHOS 79 02/02/2024 1220   AST 14 (L) 02/02/2024 1220   ALT 21 02/02/2024 1220   BILITOT 0.7 02/02/2024 1220       RADIOGRAPHIC STUDIES: I have personally reviewed the radiological images as listed and agreed with the findings in the report. No results found.  All questions were answered. The patient knows to call the clinic with any problems, questions  or concerns. I spent 30 minutes in the care of this patient including H and P, review of records, counseling and coordination of care.     Amber Stalls, MD 03/15/2024 9:17 AM

## 2024-04-17 ENCOUNTER — Telehealth: Payer: Self-pay | Admitting: Hematology and Oncology

## 2024-04-17 NOTE — Telephone Encounter (Signed)
 I spoke w pt regarding 03/17/25 appt being rescheduled to 03/18/25. Patient is aware of new appt date and time.

## 2025-03-17 ENCOUNTER — Inpatient Hospital Stay: Admitting: Hematology and Oncology

## 2025-03-18 ENCOUNTER — Inpatient Hospital Stay: Payer: Self-pay | Admitting: Hematology and Oncology
